# Patient Record
Sex: Male | Born: 1975
Health system: Southern US, Community
[De-identification: ages and names within clinical notes are randomized; demographics above are authoritative.]

## PROBLEM LIST (undated history)

## (undated) DIAGNOSIS — N4400 Torsion of testis, unspecified: Secondary | ICD-10-CM

## (undated) DIAGNOSIS — I10 Essential (primary) hypertension: Secondary | ICD-10-CM

## (undated) HISTORY — PX: FRACTURE SURGERY: SHX138

---
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
## 1997-09-07 ENCOUNTER — Emergency Department (HOSPITAL_COMMUNITY): Admission: EM | Admit: 1997-09-07 | Discharge: 1997-09-07 | Payer: Self-pay | Admitting: Emergency Medicine

## 1999-10-08 ENCOUNTER — Emergency Department (HOSPITAL_COMMUNITY): Admission: EM | Admit: 1999-10-08 | Discharge: 1999-10-08 | Payer: Self-pay | Admitting: *Deleted

## 1999-10-08 ENCOUNTER — Encounter: Payer: Self-pay | Admitting: Emergency Medicine

## 2000-07-08 ENCOUNTER — Encounter: Payer: Self-pay | Admitting: Emergency Medicine

## 2000-07-08 ENCOUNTER — Emergency Department (HOSPITAL_COMMUNITY): Admission: EM | Admit: 2000-07-08 | Discharge: 2000-07-08 | Payer: Self-pay | Admitting: Emergency Medicine

## 2000-09-07 ENCOUNTER — Encounter: Payer: Self-pay | Admitting: Emergency Medicine

## 2000-09-07 ENCOUNTER — Emergency Department (HOSPITAL_COMMUNITY): Admission: EM | Admit: 2000-09-07 | Discharge: 2000-09-07 | Payer: Self-pay | Admitting: Emergency Medicine

## 2000-09-13 ENCOUNTER — Inpatient Hospital Stay (HOSPITAL_COMMUNITY): Admission: EM | Admit: 2000-09-13 | Discharge: 2000-09-16 | Payer: Self-pay | Admitting: Emergency Medicine

## 2000-09-13 ENCOUNTER — Encounter: Payer: Self-pay | Admitting: Internal Medicine

## 2000-09-14 ENCOUNTER — Encounter: Payer: Self-pay | Admitting: Internal Medicine

## 2000-09-16 ENCOUNTER — Encounter: Payer: Self-pay | Admitting: Internal Medicine

## 2001-04-25 ENCOUNTER — Emergency Department (HOSPITAL_COMMUNITY): Admission: EM | Admit: 2001-04-25 | Discharge: 2001-04-25 | Payer: Self-pay | Admitting: *Deleted

## 2003-01-21 ENCOUNTER — Inpatient Hospital Stay (HOSPITAL_COMMUNITY): Admission: EM | Admit: 2003-01-21 | Discharge: 2003-01-27 | Payer: Self-pay | Admitting: Emergency Medicine

## 2003-01-28 ENCOUNTER — Encounter (HOSPITAL_BASED_OUTPATIENT_CLINIC_OR_DEPARTMENT_OTHER): Admission: RE | Admit: 2003-01-28 | Discharge: 2003-02-10 | Payer: Self-pay | Admitting: Orthopedic Surgery

## 2005-03-09 ENCOUNTER — Emergency Department (HOSPITAL_COMMUNITY): Admission: EM | Admit: 2005-03-09 | Discharge: 2005-03-10 | Payer: Self-pay | Admitting: Emergency Medicine

## 2006-06-24 ENCOUNTER — Encounter: Admission: RE | Admit: 2006-06-24 | Discharge: 2006-06-24 | Payer: Self-pay | Admitting: Family Medicine

## 2007-12-10 ENCOUNTER — Emergency Department (HOSPITAL_COMMUNITY): Admission: EM | Admit: 2007-12-10 | Discharge: 2007-12-10 | Payer: Self-pay | Admitting: Emergency Medicine

## 2010-08-11 NOTE — Discharge Summary (Signed)
[place]  Patient:    Carl Small, Carl Small                        MRN: [id number] Adm. Date:  [id number] Disc. Date: 09/16/00 Attending:  Phifer, Harriett Sine [place] Dictator:   Felton Clinton, M.D. CC:         Doris Cheadle. Lyman Bishop, M.D.   Discharge Summary  DISCHARGE DIAGNOSES: 1. Questionable left palatine tonsillar abscess, resolved. 2. Neutropenia, resolved.  DISCHARGE MEDICATIONS AND FOLLOW-UP:  Carl Small left the hospital against medical advice with no discharge medications.  He was given the number of the [place] to call for a follow-up appointment.  CONSULTATIONS:  Robert L. Lyman Bishop, M.D., ENT.  PROCEDURES: 1. CT of head on September 13, 2000, negative. 2. Lumbar puncture on September 13, 2000. 3. CT of neck on September 13, 2000, prominent tonsillar tissues with low-density    area in the left tonsils compatible with small tonsillar abscess. 4. CT of neck on September 16, 2000, with marked decrease in size of tonsils since    prior study with no current abscess demonstrated.  HISTORY OF PRESENT ILLNESS:  Carl Small is a 35 year old African-American male with no significant past medical history, who presented to the emergency department on September 13, 2000, complaining of severe headaches, fatigue, arthralgias/myalgias, and weakness for the past two weeks.  He was seen in the emergency department the week prior to admission for the "worst headache of his life," but left prior to work-up at that time.  On admission, he reported bilateral frontal headaches and projected fever and chills.  He has a history of migraine headaches for the past year, but states these had been worse for the past two weeks.  He also complained of significant cervical lymph node tenderness.  PHYSICAL EXAMINATION:  Temperature 102.4 degrees, pulse 98, blood pressure 114/62, respirations 26, oxygen saturation 100% on room air.  GENERAL APPEARANCE:  A  lethargic African-American male, but responsive and answers questions appropriately.  HEENT:  PERRL.  EOMI.  Oropharynx red, but no exudate able to be seen.  The patient is unable to open his mouth wide secondary to pain.  NECK:  Anterior cervical lymph nodes swollen and exquisitely tender bilaterally.  CARDIOVASCULAR:  Regular rate and rhythm.  A 2/6 systolic ejection murmur.  RESPIRATORY:  Clear with good air movement.  ABDOMEN:  Soft, flat, and diffusely tender to palpation.  No rebound or guarding.  EXTREMITIES:  No clubbing, cyanosis, or edema.  NEUROLOGIC:  Cranial nerves II-XII intact.  Nonfocal exam.  ADMISSION LABORATORY DATA:  White blood cell count 2.6, hemoglobin 15.6, platelets 150, absolute neutrophil count 0, absolute lymphocyte count 0.9, MCV 85, and atypical lymphocytes seen on smear.  Sodium 135, potassium 3.7, chloride 101, bicarbonate 28, BUN 10, creatinine 1.3, glucose 101, alkaline phosphatase 85, total bilirubin 1.3, AST 15, ALT 14, total protein 6.7, albumin 3.8, calcium 9.4.  The chest x-ray showed no active disease.  A CT of the head was negative.  HOSPITAL COURSE: #1 - QUESTIONABLE TONSILLAR ABSCESS:  Given Carl Small complaint of severe headaches and fevers, he was empirically given 2 g of Rocephin for possible meningitis.  A head CT was performed and as indicated above was negative. After these results were obtained, a lumbar puncture was performed.  The cerebrospinal fluid showed 22 protein, glucose 63, white blood cells 3, and red blood cells 2.  Grams negative, as  was [place] ink and cryptococcal antigen.  While in the emergency department, Carl Small continued to complain of severe throat and neck pain and was unable to fully open his mouth.  A neck CT was obtained and showed a low density area in the region of the left palatine tonsil concerning for a tonsillar abscess.  There were also several small lymph nodes in the carotid space and the  posterior triangle. Submandibular nodes were noted as well.  Due to the possibility of a tonsillar abscess and the patients allergy to penicillin, Carl Small was started on clindamycin.  Robert L. Lyman Bishop, M.D., was called to evaluate Carl Small and saw him on September 14, 2000.  After reviewing the CT with the radiologist, Molly Maduro L. Lawrence, M.D., was not convinced that Carl Small indeed had a tonsillar abscess and did not feel that incision and drainage were required at the present time.  Carl Small was changed to Primaxin for better coverage given his neutropenia and his symptoms resolved rather quickly on hospital day #3.  He was afebrile and a repeat neck CT done on September 16, 2000, showed a marked decrease in the size of the tonsils and no evidence of abscess was seen.  #2 - NEUTROPENIA:  Carl Small had an absolute neutropenia on admission with a neutrophil count of 0.  This was repeated and confirmed on hospital day #2. The etiology of his neutropenia was unclear as his white blood cell count had been normal the week prior to admission.  An HIV test was performed and was negative as was a mono spot test.  Carl Small white blood cell count on hospital day #3 began to trend up and on hospital day #4, his neutrophil count was within normal limits.  Again, the etiology of his neutropenia remains unclear.  DISCHARGE LABORATORY DATA:  White blood cell count 6.8, hemoglobin 14.9, MCV 86.3, platelets 176, absolute neutrophil count 1.6, and absolute lymphocyte count 2.2.  Atypical lymphocytes and mononuclear cells were seen on the smear. Sodium 140, potassium 4.1, chloride 109, bicarbonate 26, glucose 93, BUN 5, creatinine 1.2, calcium 9.0.  HIV nonreactive.  The urine drug screen was positive for cocaine, cannabinoids, and opiates.  Cerebrospinal fluid:  Protein 22, glucose 63, white blood cells 3, and red blood cells 6. The Grams smear showed no organisms and predominantly mononuclear  cells. Culture showed no growth at three days.  [place] ink preparation was negative as  was cryptococcal antigen.  AFB culture and smear were negative. Cytomegalovirus by PCR was negative as was CMV, ELISA antibody, and EBV PCR. DD:  09/27/00 TD:  09/27/00 Job: [id number]

## 2010-08-11 NOTE — Op Note (Signed)
Carl Small, Carl Small                           ACCOUNT NO.:  [id number]   MEDICAL RECORD NO.:  [id number]                   PATIENT TYPE:  INP   LOCATION:  0159                                 FACILITY:  [facility]   PHYSICIAN:  Carl Small, M.D.         DATE OF BIRTH:  1975/09/26   DATE OF PROCEDURE:  DATE OF DISCHARGE:                                 OPERATIVE REPORT   PREOPERATIVE DIAGNOSIS:  Right middle finger abscess in the deep pulp  tissue.   POSTOPERATIVE DIAGNOSIS:  Right middle finger abscess in the deep pulp  tissue.   PROCEDURE:  1. I&D large deep abscess right middle finger.  2. Nail plate removal right middle finger.   SURGEON:  Carl Ano. Amanda Pea, M.D.   ASSISTANT:  None.   COMPLICATIONS:  None.   ANESTHESIA:  General.   COMPLICATING FEATURES:  None.   ESTIMATED BLOOD LOSS:  Minimal.   SPECIMENS:  Specimens x2 sent for aerobic and anaerobic culture, fungal and  atypical mycobacterial cultures.   INDICATIONS FOR PROCEDURE:  This patient is a 35 year old male whose had a 9-  10 day history of infection in his finger. His is PENICILLIN allergic and  was seen in the [place] region and was placed on erythromycin. No I&D  was performed. The patient admits to picking at his finger just below the  nail with a sharp object and also admits to marijuana and cocaine use within  the last week. The patient has had progressive pain and worsening function.  I was asked to see him by the emergency room staff, Dr. Ileene Musa. It was  very apparent that the patient had an abscess in the pulp tissue and will  require extensive  I&D. I have given him no guarantees as the process is  well advanced and unfortunately there is risk of loss of length of the  finger if not the whole finger.   DESCRIPTION OF PROCEDURE:  The patient was seen by myself and anesthesia. He  agreed to surgery and understood the risks and benefits of bleeding,  infection, anesthesia, damage  to normal structure and failure of the surgery  to accomplish its intended goals of relieving symptoms and restoring  function. With this in mind, he desired to proceed. We proceeded to the  operative suite where he underwent induction of general anesthesia.  Following this, the patient then underwent prep and drape of the right upper  extremity with Betadine scrub and paint. Once the sterile field was secured  and he was appropriately padded, the patient had the nail plate removed  revealing abnormal beefy tissue and some abscess formation just about the  nail fold region. I then unroofed the devitalized skin and performed a I&D  where the area between the sterile matrix and pulp tissue met. This revealed  an extremely deep abscess which dissected into the pulp. I dissected into  this and released  a large amount of purulent material. This material was  decompressed and two sets of cultures were taken for aerobic, anaerobic,  Gram stain, fungal and atypical mycobacterial cultures. The patient did not  appear to have gross abscess in his flexor sheath. I did have to extend the  incision to the pulp. The patient did not appear to have significant bone  involvement, however, we will be checking and following the bone  radiographically. I I&D'd this area copiously with large amounts of saline  and decompressed the pulp nicely about the fibrous tissue. A large amount of  skin was devitalized and needed to be removed. The patient lost a large  amount of his pulp and fat due to the extensive infectious process  destroying this tissue. This was debrided sharply with a knife blade under  4.0 loupe magnification. The corners were debrided until healthy tissue was  noted. This appeared as a fish mouth type look to the finger. With this  done, further irrigation was applied followed by placement of Iodoform  gauze. Refill was excellent at the tip of the finger. I dressed it sterilely  and noted that  there was minimal blood loss but that all corners bled well  in the area of resection. Following this, I then placed a patient in a  sterile bandage. He was awakened from anesthesia and transferred to the  recovery room. The patient was somewhat agitated in the recovery room and  restraints were used for a brief period of time and he will be monitored  closely. I have discussed with the patient all issues; however, he is still  somewhat sedated from the surgery. We will monitor his condition closely and  hopefully afford arresting the infectious process.                                               Carl Small, M.D.    Carl Small  D:  01/22/2003  T:  01/22/2003  Job:  [id number]

## 2010-08-11 NOTE — Discharge Summary (Signed)
Carl Small, Carl Small                           ACCOUNT NO.:  [id number]   MEDICAL RECORD NO.:  [id number]                   PATIENT TYPE:  INP   LOCATION:  0466                                 [facility]ACILITY[facility]  [facility]   PHYSICIAN:  Dionne Ano. Amanda Pea, M.D.             DATE OF BIRTH:  1975/08/09   DATE OF ADMISSION:  01/21/2003  DATE OF DISCHARGE:  01/27/2003                                 DISCHARGE SUMMARY   ADMISSION DIAGNOSES:  1. Right ring finger distal tip abscess/cellulitis.  2. History of cocaine abuse.  3. History of tobacco abuse.  4. History of cannabis abuse.   DISCHARGE DIAGNOSES:  1. Right ring finger distal tip abscess/cellulitis, improved. Cultures     positive for methicillin-resistant Staphylococcus aureus.  2. History of cocaine abuse.  3. History of tobacco abuse.  4. History of cannabis abuse.   PROCEDURE/SURGEON:  The surgeon was Dr. Dominica Severin and the procedure was  I&D of right ring finger.   CONSULTATIONS:  Infectious disease.   BRIEF HOSPITAL COURSE:  Carl Small is a 35 year old gentleman who presented  to [facility] emergency room with complaints of  increasing swelling, pain, and tenderness about the distal tip of the right  ring finger. The patient states that approximately two weeks ago he injured  his finger on a nail. Since that time he had increased swelling, pain, and  tenderness.  He was previously seen in the ER in [place] and given  erythromycin as well as a tetanus injection. Unfortunately he has had  increased in his symptoms to the point he had to present to the emergency  room. The patient was seen and evaluation by Dr. Amanda Pea, orthopedic surgeon  on call, and found to have a right ring finger distal tip abscess with  cellulitis, possibly early purulent flexor tenosynovitis. Radiographs showed  no signs of osteomyelitis.   Carl Small underwent an I&D of the right finger of the skin, subcu, and  periosteal tissue. He  tolerated this well and there were no complications.  The patient was known to have a PENICILLIN allergy and therefore was started  on vancomycin and gentamycin as prophylactic antibiotics for pharmacy  dosing. Intraoperative cultures were obtained and showed that he had gram  positive cocci in pairs and clusters. The patient on postoperative day #2  was doing well other than increased pain and tenderness with his whirlpools.  Given this fact, he was started on valium and a morphine prior to his daily  hydrotherapy. The patient's condition continued to improve. He did have  significant discomfort with his hydrotherapy and was eventually started on  Ativan and continued morphine with good results. Final cultures reveal  methicillin-resistant Staphylococcus aureus. Infectious disease was  consulted. Recommended doxycycline p.o. 10 mg p.o. b.i.d. for fourteen days  status post at time of discharge. The patient's condition continued to  improve on January 27, 2003, after  day five of vancomycin. He was up, eating  breakfast. His pain was controlled with his medications and he was deemed  ready for discharge.  Vital signs were stable, he was afebrile. Dressings  were clean, dry, and intact. His motion had somewhat improved. His incision  was improving nicely. There was minimal edema and erythema had improved. His  range of motion was improving.   ASSESSMENT:  1. Status post irrigation and debridement right ring finger with cultures     indicative of methicillin-resistant Staphylococcus aureus.  2. History of cocaine abuse.  3. History of cannabis abuse.  4. History of tobacco abuse.   PLAN:  Condition on discharge improved. Diet is regular. Activity is to be  outpatient whirlpool, wick packing to close his wounds for granulating can  start.  We encourage range of motion and planning with dressings and  supplies to be used.   DISCHARGE MEDICATIONS:  Doxycycline _____ mg b.i.d. for four  weeks, Percocet  5/325 one to two p.o. q.4-6h. p.r.n. pain, Robaxin one p.o. q.4-6h p.r.n.  spasm.   FOLLOWUP:  With Dr. Amanda Pea per routine or call [phone] for any questions or  concerns. Care management with ________ and arranged outpatient whirlpool  and wound care.     Karie Chimera, P.A.-C.                   Dionne Ano. Amanda Pea, M.D.    BB/MEDQ  D:  03/10/2003  T:  03/10/2003  Job:  [id number]

## 2010-08-12 ENCOUNTER — Emergency Department (HOSPITAL_COMMUNITY): Payer: Self-pay

## 2010-08-12 ENCOUNTER — Emergency Department (HOSPITAL_COMMUNITY)
Admission: EM | Admit: 2010-08-12 | Discharge: 2010-08-12 | Disposition: A | Discharge status: Home / Self Care | Payer: Self-pay | Attending: Emergency Medicine | Admitting: Emergency Medicine

## 2010-08-12 DIAGNOSIS — J329 Chronic sinusitis, unspecified: Secondary | ICD-10-CM | POA: Insufficient documentation

## 2010-08-12 DIAGNOSIS — IMO0002 Reserved for concepts with insufficient information to code with codable children: Secondary | ICD-10-CM | POA: Insufficient documentation

## 2010-08-12 DIAGNOSIS — R55 Syncope and collapse: Secondary | ICD-10-CM | POA: Insufficient documentation

## 2010-08-12 DIAGNOSIS — M79609 Pain in unspecified limb: Secondary | ICD-10-CM | POA: Insufficient documentation

## 2010-08-12 DIAGNOSIS — W1809XA Striking against other object with subsequent fall, initial encounter: Secondary | ICD-10-CM | POA: Insufficient documentation

## 2010-08-12 DIAGNOSIS — R51 Headache: Secondary | ICD-10-CM | POA: Insufficient documentation

## 2010-08-12 DIAGNOSIS — E860 Dehydration: Secondary | ICD-10-CM | POA: Insufficient documentation

## 2010-08-12 LAB — CBC
HCT: 51.2 % (ref 39.0–52.0)
Hemoglobin: 18.2 g/dL — ABNORMAL HIGH (ref 13.0–17.0)
MCH: 31.3 pg (ref 26.0–34.0)
MCHC: 35.5 g/dL (ref 30.0–36.0)
MCV: 88 fL (ref 78.0–100.0)
Platelets: 159 10*3/uL (ref 150–400)
RBC: 5.82 MIL/uL — ABNORMAL HIGH (ref 4.22–5.81)
RDW: 13.4 % (ref 11.5–15.5)
WBC: 6.1 10*3/uL (ref 4.0–10.5)

## 2010-08-12 LAB — DIFFERENTIAL
Basophils Absolute: 0 10*3/uL (ref 0.0–0.1)
Basophils Relative: 0 % (ref 0–1)
Eosinophils Absolute: 0.1 10*3/uL (ref 0.0–0.7)
Eosinophils Relative: 2 % (ref 0–5)
Lymphocytes Relative: 28 % (ref 12–46)
Lymphs Abs: 1.7 10*3/uL (ref 0.7–4.0)
Monocytes Absolute: 0.8 10*3/uL (ref 0.1–1.0)
Monocytes Relative: 13 % — ABNORMAL HIGH (ref 3–12)
Neutro Abs: 3.5 10*3/uL (ref 1.7–7.7)
Neutrophils Relative %: 57 % (ref 43–77)

## 2010-08-12 LAB — BASIC METABOLIC PANEL
BUN: 11 mg/dL (ref 6–23)
CO2: 24 mEq/L (ref 19–32)
Calcium: 9.7 mg/dL (ref 8.4–10.5)
Chloride: 105 mEq/L (ref 96–112)
Creatinine, Ser: 1.23 mg/dL (ref 0.4–1.5)
GFR calc Af Amer: >60 mL/min (ref >60)
GFR calc non Af Amer: >60 mL/min (ref >60)
Glucose, Bld: 72 mg/dL (ref 70–99)
Potassium: 4 mEq/L (ref 3.5–5.1)
Sodium: 138 mEq/L (ref 135–145)

## 2010-08-12 LAB — URINALYSIS, ROUTINE W REFLEX MICROSCOPIC
Bilirubin Urine: NEGATIVE
Glucose, UA: NEGATIVE mg/dL
Hgb urine dipstick: NEGATIVE
Nitrite: NEGATIVE
Protein, ur: NEGATIVE mg/dL
Specific Gravity, Urine: 1.025 (ref 1.005–1.030)
Urobilinogen, UA: 1 mg/dL (ref 0.0–1.0)
pH: 6 (ref 5.0–8.0)

## 2010-08-12 LAB — CK: Total CK: 162 U/L (ref 7–232)

## 2011-01-23 ENCOUNTER — Emergency Department (HOSPITAL_COMMUNITY)
Admission: EM | Admit: 2011-01-23 | Discharge: 2011-01-23 | Disposition: A | Discharge status: Home / Self Care | Payer: Self-pay | Attending: Emergency Medicine | Admitting: Emergency Medicine

## 2011-01-23 DIAGNOSIS — R51 Headache: Secondary | ICD-10-CM | POA: Insufficient documentation

## 2011-01-23 DIAGNOSIS — R112 Nausea with vomiting, unspecified: Secondary | ICD-10-CM | POA: Insufficient documentation

## 2012-01-24 ENCOUNTER — Emergency Department (HOSPITAL_COMMUNITY)
Admission: EM | Admit: 2012-01-24 | Discharge: 2012-01-24 | Disposition: A | Discharge status: Home / Self Care | Payer: Self-pay | Attending: Emergency Medicine | Admitting: Emergency Medicine

## 2012-01-24 ENCOUNTER — Encounter (HOSPITAL_COMMUNITY): Payer: Self-pay | Admitting: Emergency Medicine

## 2012-01-24 DIAGNOSIS — L259 Unspecified contact dermatitis, unspecified cause: Secondary | ICD-10-CM | POA: Insufficient documentation

## 2012-01-24 DIAGNOSIS — L309 Dermatitis, unspecified: Secondary | ICD-10-CM

## 2012-01-24 HISTORY — DX: Torsion of testis, unspecified: N44.00

## 2012-01-24 LAB — URINALYSIS, ROUTINE W REFLEX MICROSCOPIC
Glucose, UA: NEGATIVE mg/dL
Hgb urine dipstick: NEGATIVE
Leukocytes, UA: NEGATIVE
Nitrite: NEGATIVE
Protein, ur: NEGATIVE mg/dL
Specific Gravity, Urine: 1.027 (ref 1.005–1.030)
Urobilinogen, UA: 2 mg/dL — ABNORMAL HIGH (ref 0.0–1.0)
pH: 6.5 (ref 5.0–8.0)

## 2012-01-24 LAB — CBC WITH DIFFERENTIAL/PLATELET
Basophils Absolute: 0 10*3/uL (ref 0.0–0.1)
Basophils Relative: 0 % (ref 0–1)
Eosinophils Absolute: 0.1 10*3/uL (ref 0.0–0.7)
Eosinophils Relative: 1 % (ref 0–5)
HCT: 50.3 % (ref 39.0–52.0)
Hemoglobin: 17.3 g/dL — ABNORMAL HIGH (ref 13.0–17.0)
Lymphocytes Relative: 20 % (ref 12–46)
Lymphs Abs: 1.8 10*3/uL (ref 0.7–4.0)
MCH: 30.1 pg (ref 26.0–34.0)
MCHC: 34.4 g/dL (ref 30.0–36.0)
MCV: 87.5 fL (ref 78.0–100.0)
Monocytes Absolute: 1 10*3/uL (ref 0.1–1.0)
Monocytes Relative: 11 % (ref 3–12)
Neutro Abs: 6.3 10*3/uL (ref 1.7–7.7)
Neutrophils Relative %: 68 % (ref 43–77)
Platelets: 198 10*3/uL (ref 150–400)
RBC: 5.75 MIL/uL (ref 4.22–5.81)
RDW: 13.7 % (ref 11.5–15.5)
WBC: 9.3 10*3/uL (ref 4.0–10.5)

## 2012-01-24 LAB — COMPREHENSIVE METABOLIC PANEL
ALT: 19 U/L (ref 0–53)
AST: 19 U/L (ref 0–37)
Albumin: 3.9 g/dL (ref 3.5–5.2)
Alkaline Phosphatase: 84 U/L (ref 39–117)
BUN: 9 mg/dL (ref 6–23)
CO2: 25 mEq/L (ref 19–32)
Calcium: 9.2 mg/dL (ref 8.4–10.5)
Chloride: 102 mEq/L (ref 96–112)
Creatinine, Ser: 1.33 mg/dL (ref 0.50–1.35)
GFR calc Af Amer: 80 mL/min — ABNORMAL LOW (ref >90)
GFR calc non Af Amer: 69 mL/min — ABNORMAL LOW (ref >90)
Glucose, Bld: 141 mg/dL — ABNORMAL HIGH (ref 70–99)
Potassium: 3.6 mEq/L (ref 3.5–5.1)
Sodium: 137 mEq/L (ref 135–145)
Total Bilirubin: 0.5 mg/dL (ref 0.3–1.2)
Total Protein: 6.8 g/dL (ref 6.0–8.3)

## 2012-01-24 MED ORDER — DIPHENHYDRAMINE HCL 25 MG PO CAPS
50.0000 mg | ORAL_CAPSULE | Freq: Once | ORAL | Status: AC
  Administered 2012-01-24: 50 mg via ORAL
  Filled 2012-01-24: qty 2

## 2012-01-24 MED ORDER — HYDROCORTISONE 1 % EX CREA
TOPICAL_CREAM | Freq: Once | CUTANEOUS | Status: AC
  Administered 2012-01-24: 1 via TOPICAL
  Filled 2012-01-24: qty 28

## 2012-01-24 MED ORDER — HYDROCORTISONE 1 % EX CREA: TOPICAL_CREAM | CUTANEOUS | Status: DC

## 2012-01-24 NOTE — Progress Notes (Signed)
Pt with no pcp nor coverage as confirmed by pt CM spoke with pt to review list of self pay pcps to further assist her with prescriptions and health care.  Discussed discounted pharmacies, DSS, health dept, needymeds.org and financial assistance programs in [place].  Pt voiced understanding and appreciation of resources and services offered 

## 2012-01-24 NOTE — ED Notes (Signed)
EAV:[id number]> Expected date:<BR> Expected time:<BR> Means of arrival:<BR> Comments:<BR> Triage 1 Lissa Hoard

## 2012-01-24 NOTE — ED Notes (Signed)
Patient reports pain of 10/10 in bilateral groin and testicles.  Patient states his pain woke him up during sleep and swelling worsened today.  Patient denies fevers.

## 2012-01-24 NOTE — ED Provider Notes (Signed)
History     CSN: [id number]  Arrival date & time 01/24/12  1620   First MD Initiated Contact with Patient 01/24/12 1646      Chief Complaint  Patient presents with  . Groin Swelling     The history is provided by the patient.   the patient reports developing irritation of the scrotum after using a new body wash in his groin area last night while in the shower.  He reports this is the first time is ever use this body wash.  He used it last night and focused in his groin area.  This morning he began having itching and discomfort in his groin and scrotum.  He denies any penile discharge.  He has no dysuria or urinary frequency.  He denies fevers or chills.  He is not a diabetic.  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  He has no nausea or vomiting.  He reports a similar episode when he used a new D. urine and his axilla several years ago where he broke out into an inflamed state in his bilateral axillas that usually resolves.  He states he always uses Dove soap because anything more than that usually irritates his skin.  His symptoms are mild to moderate.  Given the fact that his discomfort became worse he came to the ER for evaluation.  Past Medical History  Diagnosis Date  . Torsion of testicle     History reviewed. No pertinent past surgical history.  No family history on file.  History  Substance Use Topics  . Smoking status: Never Smoker   . Smokeless tobacco: Not on file  . Alcohol Use: Yes      Review of Systems  All other systems reviewed and are negative.    Allergies  Penicillins  Home Medications   Current Outpatient Rx  Name Route Sig Dispense Refill  . ASPIRIN-ACETAMINOPHEN-CAFFEINE 250-250-65 MG PO TABS Oral Take 1 tablet by mouth every 6 (six) hours as needed. Migraine    . HYDROCORTISONE 1 % EX CREA  Apply to affected area 2 times daily 15 g 0    BP 143/90  Pulse 96  Temp 98.1 F (36.7 C)  (Oral)  Resp 18  Ht 6\' 1"  (1.854 m)  Wt 225 lb (102.059 kg)  BMI 29.69 kg/m2  SpO2 98%  Physical Exam  Constitutional: He is oriented to person, place, and time. He appears well-developed and well-nourished.  HENT:  Head: Normocephalic.  Eyes: EOM are normal.  Neck: Normal range of motion.  Pulmonary/Chest: Effort normal.  Abdominal: He exhibits no distension.  Genitourinary:       Patient with signs of dermatitis of his scrotum in the base of his penis.  There is no erythema or warmth.  No crepitus noted.  Very small bilateral groin lymphadenopathy noted.  No signs of abscess or folliculitis.  No penile discharge.  No testicular tenderness.  Testicular lie is normal  Musculoskeletal: Normal range of motion.  Neurological: He is alert and oriented to person, place, and time.  Psychiatric: He has a normal mood and affect.    ED Course  Procedures (including critical care time)  Labs Reviewed  CBC WITH DIFFERENTIAL - Abnormal; Notable for the following:    Hemoglobin 17.3 (*)     All other components within normal limits  COMPREHENSIVE METABOLIC PANEL - Abnormal; Notable for the following:    Glucose, Bld 141 (*)  GFR calc non Af Amer 69 (*)     GFR calc Af Amer 80 (*)     All other components within normal limits  URINALYSIS, ROUTINE W REFLEX MICROSCOPIC - Abnormal; Notable for the following:    Color, Urine AMBER (*)  BIOCHEMICALS MAY BE AFFECTED BY COLOR   Bilirubin Urine SMALL (*)     Ketones, ur TRACE (*)     Urobilinogen, UA 2.0 (*)     All other components within normal limits   No results found.   1. Dermatitis       MDM  This appears to be a severe contact dermatitis.  He had a similar event in his axilla with the use of a certain 2 urine.  He states his symptoms are getting worse.  I see nothing to suggest that this is for nasal gangrene or other severe scrotal infection.  He has improvement in his symptoms after Benadryl and hydrocortisone cream.  Will  treat this as a dermatitis.  He understands return to the ER for new or worsening symptoms        Lyanne Co, MD 01/24/12 1858

## 2012-01-28 ENCOUNTER — Encounter (HOSPITAL_BASED_OUTPATIENT_CLINIC_OR_DEPARTMENT_OTHER): Payer: Self-pay | Admitting: *Deleted

## 2012-01-28 ENCOUNTER — Emergency Department (HOSPITAL_BASED_OUTPATIENT_CLINIC_OR_DEPARTMENT_OTHER)
Admission: EM | Admit: 2012-01-28 | Discharge: 2012-01-28 | Disposition: A | Discharge status: Home / Self Care | Payer: Self-pay | Attending: Emergency Medicine | Admitting: Emergency Medicine

## 2012-01-28 DIAGNOSIS — R21 Rash and other nonspecific skin eruption: Secondary | ICD-10-CM

## 2012-01-28 DIAGNOSIS — Z880 Allergy status to penicillin: Secondary | ICD-10-CM | POA: Insufficient documentation

## 2012-01-28 DIAGNOSIS — L293 Anogenital pruritus, unspecified: Secondary | ICD-10-CM | POA: Insufficient documentation

## 2012-01-28 DIAGNOSIS — Z7982 Long term (current) use of aspirin: Secondary | ICD-10-CM | POA: Insufficient documentation

## 2012-01-28 LAB — BASIC METABOLIC PANEL
BUN: 15 mg/dL (ref 6–23)
CO2: 24 mEq/L (ref 19–32)
Calcium: 9.5 mg/dL (ref 8.4–10.5)
Chloride: 103 mEq/L (ref 96–112)
Creatinine, Ser: 1.3 mg/dL (ref 0.50–1.35)
GFR calc Af Amer: 82 mL/min — ABNORMAL LOW (ref >90)
GFR calc non Af Amer: 71 mL/min — ABNORMAL LOW (ref >90)
Glucose, Bld: 80 mg/dL (ref 70–99)
Potassium: 4.5 mEq/L (ref 3.5–5.1)
Sodium: 139 mEq/L (ref 135–145)

## 2012-01-28 LAB — CBC WITH DIFFERENTIAL/PLATELET
Basophils Absolute: 0 10*3/uL (ref 0.0–0.1)
Basophils Relative: 0 % (ref 0–1)
Eosinophils Absolute: 0.4 10*3/uL (ref 0.0–0.7)
Eosinophils Relative: 5 % (ref 0–5)
HCT: 46.8 % (ref 39.0–52.0)
Hemoglobin: 16.2 g/dL (ref 13.0–17.0)
Lymphocytes Relative: 28 % (ref 12–46)
Lymphs Abs: 2.2 10*3/uL (ref 0.7–4.0)
MCH: 30.5 pg (ref 26.0–34.0)
MCHC: 34.6 g/dL (ref 30.0–36.0)
MCV: 88 fL (ref 78.0–100.0)
Monocytes Absolute: 1.1 10*3/uL — ABNORMAL HIGH (ref 0.1–1.0)
Monocytes Relative: 14 % — ABNORMAL HIGH (ref 3–12)
Neutro Abs: 4 10*3/uL (ref 1.7–7.7)
Neutrophils Relative %: 53 % (ref 43–77)
Platelets: 188 10*3/uL (ref 150–400)
RBC: 5.32 MIL/uL (ref 4.22–5.81)
RDW: 14 % (ref 11.5–15.5)
WBC: 7.6 10*3/uL (ref 4.0–10.5)

## 2012-01-28 LAB — LACTIC ACID, PLASMA: Lactic Acid, Venous: 1.3 mmol/L (ref 0.5–2.2)

## 2012-01-28 MED ORDER — SODIUM CHLORIDE 0.9 % IV BOLUS (SEPSIS)
1000.0000 mL | Freq: Once | INTRAVENOUS | Status: AC
  Administered 2012-01-28: 1000 mL via INTRAVENOUS

## 2012-01-28 MED ORDER — SODIUM CHLORIDE 0.9 % IV SOLN
Freq: Once | INTRAVENOUS | Status: AC
  Administered 2012-01-28: 20 mL/h via INTRAVENOUS

## 2012-01-28 MED ORDER — OXYCODONE-ACETAMINOPHEN 5-325 MG PO TABS: 1.0000 | ORAL_TABLET | ORAL | Status: DC | PRN

## 2012-01-28 MED ORDER — TOLNAFTATE 1 % EX POWD: Freq: Two times a day (BID) | CUTANEOUS | Status: DC

## 2012-01-28 MED ORDER — HYDROMORPHONE HCL PF 1 MG/ML IJ SOLN
1.0000 mg | Freq: Once | INTRAMUSCULAR | Status: AC
  Administered 2012-01-28: 1 mg via INTRAVENOUS
  Filled 2012-01-28: qty 1

## 2012-01-28 MED ORDER — LIDOCAINE HCL 2 % EX GEL
Freq: Once | CUTANEOUS | Status: AC
  Administered 2012-01-28: 20 via TOPICAL
  Filled 2012-01-28: qty 20

## 2012-01-28 MED ORDER — LIDOCAINE HCL 2 % EX GEL: Freq: Once | CUTANEOUS | Status: DC

## 2012-01-28 MED ORDER — DOXYCYCLINE HYCLATE 100 MG PO TABS
100.0000 mg | ORAL_TABLET | Freq: Once | ORAL | Status: AC
  Administered 2012-01-28: 100 mg via ORAL
  Filled 2012-01-28: qty 1

## 2012-01-28 MED ORDER — DOXYCYCLINE HYCLATE 100 MG PO CAPS: 100.0000 mg | ORAL_CAPSULE | Freq: Two times a day (BID) | ORAL | Status: DC

## 2012-01-28 NOTE — ED Notes (Signed)
Pt c/o rash to testicles x 5 days , seen by [facility] ed on Thursday  Cream and benadryl not working

## 2012-01-28 NOTE — ED Provider Notes (Signed)
History     CSN: [id number]  Arrival date & time 01/28/12  9528   First MD Initiated Contact with Patient 01/28/12 1943      Chief Complaint  Patient presents with  . Rash    Patient is a 36 y.o. male presenting with rash. The history is provided by the patient.  Rash  This is a recurrent problem. The current episode started more than 2 days ago. The problem has been gradually worsening. The problem is associated with a new detergent/soap. There has been no fever. Affected Location: groin. The pain is severe. The pain has been constant since onset. Associated symptoms include itching and pain. He has tried steriods for the symptoms. The treatment provided no relief.  pt seen recently for dermatitis to his groin.  He was placed on steroid cream but his symptoms are worsening No fever/vomiting.  No trauma.  No other rash to his body.  He thinks it was due to recent new soap He is able to pass urine without difficulty  Past Medical History  Diagnosis Date  . Torsion of testicle     History reviewed. No pertinent past surgical history.  History reviewed. No pertinent family history.  History  Substance Use Topics  . Smoking status: Current Every Day Smoker -- 0.5 packs/day  . Smokeless tobacco: Not on file  . Alcohol Use: Yes      Review of Systems  Constitutional: Negative for fever.  Skin: Positive for itching and rash.  Neurological: Negative for weakness.  All other systems reviewed and are negative.    Allergies  Penicillins  Home Medications   Current Outpatient Rx  Name  Route  Sig  Dispense  Refill  . ASPIRIN-ACETAMINOPHEN-CAFFEINE 250-250-65 MG PO TABS   Oral   Take 1 tablet by mouth every 6 (six) hours as needed. Migraine         . HYDROCORTISONE 1 % EX CREA      Apply to affected area 2 times daily   15 g   0     BP 145/90  Pulse 77  Temp 98.1 F (36.7 C) (Oral)  Resp 16  Ht 6\' 1"  (1.854 m)  Wt 225 lb (102.059 kg)  BMI 29.69 kg/m2   SpO2 100%  Physical Exam CONSTITUTIONAL: Well developed/well nourished HEAD AND FACE: Normocephalic/atraumatic EYES: EOMI/PERRL ENMT: Mucous membranes moist NECK: supple no meningeal signs SPINE:entire spine nontender CV: S1/S2 noted, no murmurs/rubs/gallops noted LUNGS: Lungs are clear to auscultation bilaterally, no apparent distress ABDOMEN: soft, nontender, no rebound or guarding UX:LKGMWNU is tender to palpation.  There is erythema noted, mostly on right side of scrotum. No drainage.  No crepitance.  Testicles are in normal lie  Chaperone present NEURO: Pt is awake/alert, moves all extremitiesx4 EXTREMITIES: pulses normal, full ROM. Erythema noted to inner thighs bilaterally SKIN: warm, color normal PSYCH:anxious  ED Course  Procedures  8:10 PM Pt with severe pain due to rash/excoriation.  Will treat pain and reassess   Pt improved with pain meds Pt has some erythema/excoriation to the right side of his scrotum, but no active bleeding, no crepitance.  No tenderness/erythema/abscess noted in perineum.  Doubtful this is fourniers gangrene.  No signs of abscess.  He also has some erythema along inner thighs.  Suspect there may some fungal component (tinea cruris) tinactin ordered as well doxycycline as he could have early cellulitis.  He is nontoxic in appearance.  Discussed need for repeat exam in 24-48 hours if he is not  improving  MDM  Nursing notes including past medical history and social history reviewed and considered in documentation Previous records reviewed and considered - recent ED notes reviewed Labs/vital reviewed and considered         Joya Gaskins, MD 01/28/12 2246

## 2012-01-30 ENCOUNTER — Encounter (HOSPITAL_BASED_OUTPATIENT_CLINIC_OR_DEPARTMENT_OTHER): Payer: Self-pay | Admitting: *Deleted

## 2012-01-30 ENCOUNTER — Emergency Department (HOSPITAL_BASED_OUTPATIENT_CLINIC_OR_DEPARTMENT_OTHER)
Admission: EM | Admit: 2012-01-30 | Discharge: 2012-01-30 | Disposition: A | Discharge status: Home / Self Care | Payer: Self-pay | Attending: Emergency Medicine | Admitting: Emergency Medicine

## 2012-01-30 DIAGNOSIS — L259 Unspecified contact dermatitis, unspecified cause: Secondary | ICD-10-CM | POA: Insufficient documentation

## 2012-01-30 DIAGNOSIS — F17200 Nicotine dependence, unspecified, uncomplicated: Secondary | ICD-10-CM | POA: Insufficient documentation

## 2012-01-30 DIAGNOSIS — R21 Rash and other nonspecific skin eruption: Secondary | ICD-10-CM | POA: Insufficient documentation

## 2012-01-30 DIAGNOSIS — Z87448 Personal history of other diseases of urinary system: Secondary | ICD-10-CM | POA: Insufficient documentation

## 2012-01-30 MED ORDER — PREDNISONE 10 MG PO TABS: 20.0000 mg | ORAL_TABLET | Freq: Two times a day (BID) | ORAL | Status: DC

## 2012-01-30 MED ORDER — NYSTATIN 100000 UNIT/GM EX POWD: Freq: Four times a day (QID) | CUTANEOUS | Status: DC

## 2012-01-30 NOTE — ED Provider Notes (Signed)
History     CSN: [id number]  Arrival date & time 01/30/12  1927   First MD Initiated Contact with Patient 01/30/12 1940      Chief Complaint  Patient presents with  . Wound Check    (Consider location/radiation/quality/duration/timing/severity/associated sxs/prior treatment) HPI Comments: Patient for recheck of rash on his scrotum.  This started about one week ago after using a new body wash.  He has been seen twice since that time and diagnosed with contact dermatitis, possible fungal and bacterial infections.  He is not improving with doxycycline, tinactin, and hydrocortisone cream.  These medications burn severely when he applies them.  There are no urinary symptoms.  No fevers or chills.  Denies new sexual contacts.    Patient is a 36 y.o. male presenting with wound check. The history is provided by the patient.  Wound Check  Treated in ED: one week ago. Treatments since wound repair include oral antibiotics.    Past Medical History  Diagnosis Date  . Torsion of testicle     History reviewed. No pertinent past surgical history.  History reviewed. No pertinent family history.  History  Substance Use Topics  . Smoking status: Current Every Day Smoker -- 0.5 packs/day  . Smokeless tobacco: Not on file  . Alcohol Use: Yes      Review of Systems  All other systems reviewed and are negative.    Allergies  Penicillins  Home Medications   Current Outpatient Rx  Name  Route  Sig  Dispense  Refill  . DOXYCYCLINE HYCLATE 100 MG PO CAPS   Oral   Take 1 capsule (100 mg total) by mouth 2 (two) times daily.   14 capsule   0   . OXYCODONE-ACETAMINOPHEN 5-325 MG PO TABS   Oral   Take 1 tablet by mouth every 4 (four) hours as needed for pain.   15 tablet   0   . ASPIRIN-ACETAMINOPHEN-CAFFEINE 250-250-65 MG PO TABS   Oral   Take 1 tablet by mouth every 6 (six) hours as needed. Migraine         . HYDROCORTISONE 1 % EX CREA      Apply to affected area 2 times  daily   15 g   0   . TOLNAFTATE 1 % EX POWD   Topical   Apply topically 2 (two) times daily.   45 g   0     BP 136/93  Pulse 70  Temp 98.2 F (36.8 C) (Oral)  Resp 18  Ht 6' (1.829 m)  Wt 225 lb (102.059 kg)  BMI 30.52 kg/m2  SpO2 98%  Physical Exam  Nursing note and vitals reviewed. Constitutional: He is oriented to person, place, and time. He appears well-developed and well-nourished. No distress.  HENT:  Head: Normocephalic.  Mouth/Throat: Oropharynx is clear and moist.  Neck: Normal range of motion. Neck supple.  Genitourinary:       There is an excoriated area to the anterior aspect of the scrotum below the penis.  There is no erythema or warmth that I can appreciate and I do not feel any crepitus.  He is quite tender in this area.  Neurological: He is alert and oriented to person, place, and time.  Skin: Skin is warm and dry. He is not diaphoretic.    ED Course  Procedures (including critical care time)  Labs Reviewed - No data to display No results found.   No diagnosis found.    MDM  This  area appears to be a severe contact dermatitis, possibly a candidal infection.  I will have him stop all creams and try nystatin powder, prednisone, and continue the doxycycline he was started on.          Geoffery Lyons, MD 01/30/12 2028

## 2012-01-30 NOTE — ED Notes (Signed)
Pt here for recheck of wound to groin area

## 2013-08-01 ENCOUNTER — Other Ambulatory Visit: Payer: Self-pay

## 2013-08-01 ENCOUNTER — Emergency Department (HOSPITAL_COMMUNITY)
Admission: EM | Admit: 2013-08-01 | Discharge: 2013-08-01 | Discharge status: Against Medical Advice | Payer: Self-pay | Attending: Emergency Medicine | Admitting: Emergency Medicine

## 2013-08-01 ENCOUNTER — Encounter (HOSPITAL_COMMUNITY): Payer: Self-pay | Admitting: Emergency Medicine

## 2013-08-01 ENCOUNTER — Encounter (HOSPITAL_BASED_OUTPATIENT_CLINIC_OR_DEPARTMENT_OTHER): Payer: Self-pay | Admitting: Emergency Medicine

## 2013-08-01 ENCOUNTER — Emergency Department (HOSPITAL_BASED_OUTPATIENT_CLINIC_OR_DEPARTMENT_OTHER)
Admission: EM | Admit: 2013-08-01 | Discharge: 2013-08-01 | Disposition: A | Discharge status: Home / Self Care | Payer: Self-pay | Attending: Emergency Medicine | Admitting: Emergency Medicine

## 2013-08-01 ENCOUNTER — Emergency Department (HOSPITAL_COMMUNITY): Payer: Self-pay

## 2013-08-01 DIAGNOSIS — R0789 Other chest pain: Secondary | ICD-10-CM

## 2013-08-01 DIAGNOSIS — F17200 Nicotine dependence, unspecified, uncomplicated: Secondary | ICD-10-CM | POA: Insufficient documentation

## 2013-08-01 DIAGNOSIS — R071 Chest pain on breathing: Secondary | ICD-10-CM | POA: Insufficient documentation

## 2013-08-01 DIAGNOSIS — R0602 Shortness of breath: Secondary | ICD-10-CM | POA: Insufficient documentation

## 2013-08-01 DIAGNOSIS — Z87448 Personal history of other diseases of urinary system: Secondary | ICD-10-CM | POA: Insufficient documentation

## 2013-08-01 DIAGNOSIS — Z880 Allergy status to penicillin: Secondary | ICD-10-CM | POA: Insufficient documentation

## 2013-08-01 DIAGNOSIS — R42 Dizziness and giddiness: Secondary | ICD-10-CM | POA: Insufficient documentation

## 2013-08-01 DIAGNOSIS — M62838 Other muscle spasm: Secondary | ICD-10-CM

## 2013-08-01 DIAGNOSIS — R209 Unspecified disturbances of skin sensation: Secondary | ICD-10-CM | POA: Insufficient documentation

## 2013-08-01 LAB — BASIC METABOLIC PANEL
BUN: 18 mg/dL (ref 6–23)
CHLORIDE: 100 meq/L (ref 96–112)
CO2: 24 meq/L (ref 19–32)
Calcium: 9.7 mg/dL (ref 8.4–10.5)
Creatinine, Ser: 1.46 mg/dL — ABNORMAL HIGH (ref 0.50–1.35)
GFR calc Af Amer: 70 mL/min — ABNORMAL LOW (ref >90)
GFR calc non Af Amer: 61 mL/min — ABNORMAL LOW (ref >90)
Glucose, Bld: 107 mg/dL — ABNORMAL HIGH (ref 70–99)
Potassium: 4.5 mEq/L (ref 3.7–5.3)
SODIUM: 140 meq/L (ref 137–147)

## 2013-08-01 LAB — CBC
HEMATOCRIT: 52.6 % — AB (ref 39.0–52.0)
HEMOGLOBIN: 18 g/dL — AB (ref 13.0–17.0)
MCH: 31 pg (ref 26.0–34.0)
MCHC: 34.2 g/dL (ref 30.0–36.0)
MCV: 90.7 fL (ref 78.0–100.0)
PLATELETS: 220 10*3/uL (ref 150–400)
RBC: 5.8 MIL/uL (ref 4.22–5.81)
RDW: 13.8 % (ref 11.5–15.5)
WBC: 7.5 10*3/uL (ref 4.0–10.5)

## 2013-08-01 LAB — I-STAT TROPONIN, ED: TROPONIN I, POC: 0 ng/mL (ref 0.00–0.08)

## 2013-08-01 LAB — PRO B NATRIURETIC PEPTIDE

## 2013-08-01 MED ORDER — CYCLOBENZAPRINE HCL 10 MG PO TABS: 10.0000 mg | ORAL_TABLET | Freq: Three times a day (TID) | ORAL | Status: DC | PRN

## 2013-08-01 MED ORDER — IBUPROFEN 800 MG PO TABS: 800.0000 mg | ORAL_TABLET | Freq: Three times a day (TID) | ORAL | Status: DC | PRN

## 2013-08-01 NOTE — ED Notes (Signed)
C/o L sided chest tightness, dizziness, and sob that started 45 min ago while sitting on couch.  Pt states he had just got home from work and finished smoking a cigarette.  Denies nausea or vomiting.

## 2013-08-01 NOTE — ED Notes (Signed)
Patient here with 1 week of chest pain that is increasing the past 2 days. Pain increased again today. Also complains of left sided numbness x 2 days, reports that he feels as if he cant lift his arm

## 2013-08-01 NOTE — ED Provider Notes (Signed)
CSN: [id number]     Arrival date & time 08/01/13  [id number] History  This chart was scribed for Charles B. Bernette MayersSheldon, MD by Danella Maiersaroline Early, ED Scribe. This patient was seen in room MH12/MH12 and the patient's care was started at 6:35 PM.    Chief Complaint  Patient presents with  . Chest Pain   The history is provided by the patient. No language interpreter was used.   HPI Comments: Carl Small is a 38 y.o. male who presents to the Emergency Department complaining of left-sided CP onset one week ago that worsened in the last 2 days described as tightness. He states it worsens with deep breathing. He reports associated intermittent left arm numbness and weakness over the last 2 days and reports 2 episodes where he could not lift his arm, lasted minutes. He is a smoker. He denies long car or plane rides. He denies increased activity recently.    Past Medical History  Diagnosis Date  . Torsion of testicle    History reviewed. No pertinent past surgical history. No family history on file. History  Substance Use Topics  . Smoking status: Current Every Day Smoker -- 0.50 packs/day  . Smokeless tobacco: Not on file  . Alcohol Use: Yes    Review of Systems  Cardiovascular: Positive for chest pain.  Neurological: Positive for numbness.   A complete 10 system review of systems was obtained and all systems are negative except as noted in the HPI and PMH.     Allergies  Penicillins  Home Medications   Prior to Admission medications   Not on File   BP 123/85  Pulse 72  Temp(Src) 98 F (36.7 C)  Resp 18  SpO2 100% Physical Exam  Nursing note and vitals reviewed. Constitutional: He is oriented to person, place, and time. He appears well-developed and well-nourished.  HENT:  Head: Normocephalic and atraumatic.  Eyes: EOM are normal. Pupils are equal, round, and reactive to light.  Neck: Normal range of motion. Neck supple.  Cardiovascular: Normal rate, normal heart sounds and intact  distal pulses.   Pulmonary/Chest: Effort normal and breath sounds normal. He exhibits tenderness.  Abdominal: Bowel sounds are normal. He exhibits no distension. There is no tenderness.  Musculoskeletal: Normal range of motion. He exhibits tenderness (L shoulder muscles). He exhibits no edema.  Neurological: He is alert and oriented to person, place, and time. He has normal strength. No cranial nerve deficit or sensory deficit.  Skin: Skin is warm and dry. No rash noted.  Psychiatric: He has a normal mood and affect.    ED Course  Procedures (including critical care time) Medications - No data to display  DIAGNOSTIC STUDIES: Oxygen Saturation is 100% on RA, normal by my interpretation.    COORDINATION OF CARE: 6:43 PM- Discussed treatment plan with pt. Pt agrees to plan.    Labs Review Labs Reviewed - No data to display  Imaging Review Dg Chest 2 View  08/01/2013   CLINICAL DATA:  Chest pain  EXAM: CHEST  2 VIEW  COMPARISON:  12/10/2007  FINDINGS: Normal heart size and mediastinal contours. No acute infiltrate or edema. No effusion or pneumothorax. No acute osseous findings.  IMPRESSION: No active cardiopulmonary disease.   Electronically Signed   By: Tiburcio PeaJonathan  Watts M.D.   On: 08/01/2013 02:29     EKG Interpretation   Date/Time:  Saturday Aug 01 2013 18:28:40 EDT Ventricular Rate:  76 PR Interval:  148 QRS Duration: 80 QT Interval:  378 QTC Calculation: 425 R Axis:   90 Text Interpretation:  Normal sinus rhythm Rightward axis Borderline ECG  Since last tracing Rate slower Confirmed by [facility] Center[facility]LD[facility]  MD, Leonette MostHARLES [phone]([id number])  on 08/01/2013 6:37:00 PM      MDM   Final diagnoses:  Chest wall pain  Muscle spasm of left shoulder area    [facility] but left prior to being seen. He did have labs done though that were normal, including Trop and BNP. Low risk for CAD, reproducible pain with palpation and ROM of LUE. NSAIDs, rest, PCP followup.   I  personally performed the services described in this documentation, which was scribed in my presence. The recorded information has been reviewed and is accurate.      Charles B. Bernette MayersSheldon, MD 08/02/13 [phone]

## 2013-08-01 NOTE — Discharge Instructions (Signed)
Chest Wall Pain °Chest wall pain is pain in or around the bones and muscles of your chest. It may take up to 6 weeks to get better. It may take longer if you must stay physically active in your work and activities.  °CAUSES  °Chest wall pain may happen on its own. However, it may be caused by: °· A viral illness like the flu. °· Injury. °· Coughing. °· Exercise. °· Arthritis. °· Fibromyalgia. °· Shingles. °HOME CARE INSTRUCTIONS  °· Avoid overtiring physical activity. Try not to strain or perform activities that cause pain. This includes any activities using your chest or your abdominal and side muscles, especially if heavy weights are used. °· Put ice on the sore area. °· Put ice in a plastic bag. °· Place a towel between your skin and the bag. °· Leave the ice on for 15-20 minutes per hour while awake for the first 2 days. °· Only take over-the-counter or prescription medicines for pain, discomfort, or fever as directed by your caregiver. °SEEK IMMEDIATE MEDICAL CARE IF:  °· Your pain increases, or you are very uncomfortable. °· You have a fever. °· Your chest pain becomes worse. °· You have new, unexplained symptoms. °· You have nausea or vomiting. °· You feel sweaty or lightheaded. °· You have a cough with phlegm (sputum), or you cough up blood. °MAKE SURE YOU:  °· Understand these instructions. °· Will watch your condition. °· Will get help right away if you are not doing well or get worse. °Document Released: 03/12/2005 Document Revised: 06/04/2011 Document Reviewed: 11/06/2010 °ExitCare® Patient Information ©2014 [employer]. °Musculoskeletal Pain °Musculoskeletal pain is muscle and boney aches and pains. These pains can occur in any part of the body. Your caregiver may treat you without knowing the cause of the pain. They may treat you if blood or urine tests, X-rays, and other tests were normal.  °CAUSES °There is often not a definite cause or reason for these pains. These pains may be caused by a type of  germ (virus). The discomfort may also come from overuse. Overuse includes working out too hard when your body is not fit. Boney aches also come from weather changes. Bone is sensitive to atmospheric pressure changes. °HOME CARE INSTRUCTIONS  °Ask when your test results will be ready. Make sure you get your test results. °Only take over-the-counter or prescription medicines for pain, discomfort, or fever as directed by your caregiver. If you were given medications for your condition, do not drive, operate machinery or power tools, or sign legal documents for 24 hours. Do not drink alcohol. Do not take sleeping pills or other medications that may interfere with treatment. °Continue all activities unless the activities cause more pain. When the pain lessens, slowly resume normal activities. Gradually increase the intensity and duration of the activities or exercise. °During periods of severe pain, bed rest may be helpful. Lay or sit in any position that is comfortable. °Putting ice on the injured area. °Put ice in a bag. °Place a towel between your skin and the bag. °Leave the ice on for 15 to 20 minutes, 3 to 4 times a day. °Follow up with your caregiver for continued problems and no reason can be found for the pain. If the pain becomes worse or does not go away, it may be necessary to repeat tests or do additional testing. Your caregiver may need to look further for a possible cause. °SEEK IMMEDIATE MEDICAL CARE IF: °You have pain that is getting worse   and is not relieved by medications. °You develop chest pain that is associated with shortness or breath, sweating, feeling sick to your stomach (nauseous), or throw up (vomit). °Your pain becomes localized to the abdomen. °You develop any new symptoms that seem different or that concern you. °MAKE SURE YOU:  °Understand these instructions. °Will watch your condition. °Will get help right away if you are not doing well or get worse. °Document Released: 03/12/2005  Document Revised: 06/04/2011 Document Reviewed: 11/14/2012 °ExitCare® Patient Information ©2014 [employer]. ° °

## 2013-08-01 NOTE — ED Notes (Signed)
No answer when called 2 times to go back to the room

## 2013-09-26 ENCOUNTER — Emergency Department (INDEPENDENT_AMBULATORY_CARE_PROVIDER_SITE_OTHER)
Admission: EM | Admit: 2013-09-26 | Discharge: 2013-09-26 | Disposition: A | Discharge status: Home / Self Care | Payer: Self-pay | Source: Home / Self Care | Attending: Emergency Medicine | Admitting: Emergency Medicine

## 2013-09-26 ENCOUNTER — Encounter (HOSPITAL_COMMUNITY): Payer: Self-pay | Admitting: Emergency Medicine

## 2013-09-26 DIAGNOSIS — L259 Unspecified contact dermatitis, unspecified cause: Secondary | ICD-10-CM

## 2013-09-26 MED ORDER — HYDROCORTISONE 1 % EX CREA: TOPICAL_CREAM | CUTANEOUS | Status: DC

## 2013-09-26 MED ORDER — PREDNISONE 20 MG PO TABS: 20.0000 mg | ORAL_TABLET | Freq: Two times a day (BID) | ORAL | Status: DC

## 2013-09-26 NOTE — ED Notes (Signed)
C/O penile itching and irritation x few days without discharge.  Girlfriend was diagnosed with BV this morning.

## 2013-09-26 NOTE — Discharge Instructions (Signed)

## 2013-09-26 NOTE — ED Provider Notes (Signed)
  Chief Complaint   Chief Complaint  Patient presents with  . Rash    History of Present Illness   Carl Small is a 38 year old male who has had a 3-4 day history of penile itching, irritation, and rash. He denies any dysuria or discharge. No ulcers or blisters on the penis. He denies any swelling of the testicles, testicular pain, or inguinal lymphadenopathy. He has had no fever, chills, nausea, vomiting, or abdominal pain. He uses condoms. His girlfriend has been diagnosed with bacterial vaginosis. He was recently tested for STDs and everything came back negative.  Review of Systems   Other than as noted above, the patient denies any of the following symptoms: Systemic:  No fevers chills, arthralgias, or adenopathy. GI:  No abdominal pain, nausea or vomiting. GU:  No dysuria, penile pain, discharge, itching, dysuria, genital lesions, testicular pain or swelling. Skin:  No rash or itching.  PMFSH   Past medical history, family history, social history, meds, and allergies were reviewed. He is allergic to penicillin.  Physical Examination    Vital signs:  BP 152/97  Pulse 79  Temp(Src) 98.3 F (36.8 C) (Oral)  Resp 16  SpO2 97% Gen:  Alert, oriented, in no distress. Abdomen:  Soft and flat, non-distended, and non-tender.  No organomegaly or mass. Genital:  There is a fine, maculopapular rash on the ventral aspect of the penis extending onto the scrotum. This is associated with hypopigmentation. There no ulcerations or blisters. No urethral discharge. Testes are normal. No inguinal lymphadenopathy. Skin:  Warm and dry.  No rash.    Assessment   The encounter diagnosis was Contact dermatitis.  No evidence of STDs.  Plan    1.  Meds:  The following meds were prescribed:   Discharge Medication List as of 09/26/2013  3:01 PM    START taking these medications   Details  hydrocortisone cream 1 % Apply to affected area 2 times daily, Normal    predniSONE (DELTASONE) 20 MG  tablet Take 1 tablet (20 mg total) by mouth 2 (two) times daily., Starting 09/26/2013, Until Discontinued, Normal        2.  Patient Education/Counseling:  The patient was given appropriate handouts, self care instructions, and instructed in symptomatic relief.The patient was instructed to inform all sexual contacts, avoid intercourse completely for 2 weeks and then only with a condom.  The patient was told that we would call about all abnormal lab results, and that we would need to report certain kinds of infection to the health department.  Avoid intercourse for the next week.  3.  Follow up:  The patient was told to follow up here if no better in 3 to 4 days, or sooner if becoming worse in any way, and given some red flag symptoms such as fever, pain, or difficulty urinating which would prompt immediate return.  If no improvement within one to 2 weeks, see Dr. Para SkeansFred Lupton.     Carl Likesavid C Camree Wigington, MD 09/26/13 [phone]

## 2013-09-30 ENCOUNTER — Encounter (HOSPITAL_BASED_OUTPATIENT_CLINIC_OR_DEPARTMENT_OTHER): Payer: Self-pay | Admitting: Emergency Medicine

## 2013-09-30 ENCOUNTER — Emergency Department (HOSPITAL_BASED_OUTPATIENT_CLINIC_OR_DEPARTMENT_OTHER)
Admission: EM | Admit: 2013-09-30 | Discharge: 2013-09-30 | Disposition: A | Discharge status: Home / Self Care | Payer: Self-pay | Attending: Emergency Medicine | Admitting: Emergency Medicine

## 2013-09-30 DIAGNOSIS — B356 Tinea cruris: Secondary | ICD-10-CM | POA: Insufficient documentation

## 2013-09-30 DIAGNOSIS — F17200 Nicotine dependence, unspecified, uncomplicated: Secondary | ICD-10-CM | POA: Insufficient documentation

## 2013-09-30 DIAGNOSIS — Z880 Allergy status to penicillin: Secondary | ICD-10-CM | POA: Insufficient documentation

## 2013-09-30 DIAGNOSIS — M2550 Pain in unspecified joint: Secondary | ICD-10-CM | POA: Insufficient documentation

## 2013-09-30 DIAGNOSIS — IMO0002 Reserved for concepts with insufficient information to code with codable children: Secondary | ICD-10-CM | POA: Insufficient documentation

## 2013-09-30 DIAGNOSIS — Z79899 Other long term (current) drug therapy: Secondary | ICD-10-CM | POA: Insufficient documentation

## 2013-09-30 LAB — CBC WITH DIFFERENTIAL/PLATELET
Basophils Absolute: 0 10*3/uL (ref 0.0–0.1)
Basophils Relative: 0 % (ref 0–1)
Eosinophils Absolute: 0.1 10*3/uL (ref 0.0–0.7)
Eosinophils Relative: 1 % (ref 0–5)
HCT: 46.3 % (ref 39.0–52.0)
Hemoglobin: 15.9 g/dL (ref 13.0–17.0)
LYMPHS ABS: 3 10*3/uL (ref 0.7–4.0)
LYMPHS PCT: 28 % (ref 12–46)
MCH: 30.8 pg (ref 26.0–34.0)
MCHC: 34.3 g/dL (ref 30.0–36.0)
MCV: 89.6 fL (ref 78.0–100.0)
Monocytes Absolute: 1.2 10*3/uL — ABNORMAL HIGH (ref 0.1–1.0)
Monocytes Relative: 11 % (ref 3–12)
NEUTROS PCT: 60 % (ref 43–77)
Neutro Abs: 6.5 10*3/uL (ref 1.7–7.7)
PLATELETS: 178 10*3/uL (ref 150–400)
RBC: 5.17 MIL/uL (ref 4.22–5.81)
RDW: 13.9 % (ref 11.5–15.5)
WBC: 10.9 10*3/uL — AB (ref 4.0–10.5)

## 2013-09-30 LAB — COMPREHENSIVE METABOLIC PANEL
ALK PHOS: 76 U/L (ref 39–117)
ALT: 22 U/L (ref 0–53)
AST: 16 U/L (ref 0–37)
Albumin: 3.7 g/dL (ref 3.5–5.2)
Anion gap: 12 (ref 5–15)
BUN: 15 mg/dL (ref 6–23)
CO2: 24 meq/L (ref 19–32)
Calcium: 9.3 mg/dL (ref 8.4–10.5)
Chloride: 106 mEq/L (ref 96–112)
Creatinine, Ser: 1.4 mg/dL — ABNORMAL HIGH (ref 0.50–1.35)
GFR calc non Af Amer: 64 mL/min — ABNORMAL LOW (ref >90)
GFR, EST AFRICAN AMERICAN: 74 mL/min — AB (ref >90)
GLUCOSE: 111 mg/dL — AB (ref 70–99)
POTASSIUM: 3.9 meq/L (ref 3.7–5.3)
SODIUM: 142 meq/L (ref 137–147)
TOTAL PROTEIN: 6.6 g/dL (ref 6.0–8.3)
Total Bilirubin: 0.3 mg/dL (ref 0.3–1.2)

## 2013-09-30 MED ORDER — CLOTRIMAZOLE 1 % EX CREA: TOPICAL_CREAM | CUTANEOUS | Status: DC

## 2013-09-30 MED ORDER — HYDROCODONE-ACETAMINOPHEN 5-325 MG PO TABS: 1.0000 | ORAL_TABLET | ORAL | Status: DC | PRN

## 2013-09-30 NOTE — ED Notes (Addendum)
C/o "pain in all my joints" started 2pm yesterday-states his legs made him fall yesterday-also concerned with no improvement to "dermatitis" to genital area-slow steady gait to triage

## 2013-09-30 NOTE — Discharge Instructions (Signed)
Stop taking your prednisone. Follow up for continued or worsening symptoms Musculoskeletal Pain Musculoskeletal pain is muscle and boney aches and pains. These pains can occur in any part of the body. Your caregiver may treat you without knowing the cause of the pain. They may treat you if blood or urine tests, X-rays, and other tests were normal.  CAUSES There is often not a definite cause or reason for these pains. These pains may be caused by a type of germ (virus). The discomfort may also come from overuse. Overuse includes working out too hard when your body is not fit. Boney aches also come from weather changes. Bone is sensitive to atmospheric pressure changes. HOME CARE INSTRUCTIONS   Ask when your test results will be ready. Make sure you get your test results.  Only take over-the-counter or prescription medicines for pain, discomfort, or fever as directed by your caregiver. If you were given medications for your condition, do not drive, operate machinery or power tools, or sign legal documents for 24 hours. Do not drink alcohol. Do not take sleeping pills or other medications that may interfere with treatment.  Continue all activities unless the activities cause more pain. When the pain lessens, slowly resume normal activities. Gradually increase the intensity and duration of the activities or exercise.  During periods of severe pain, bed rest may be helpful. Lay or sit in any position that is comfortable.  Putting ice on the injured area.  Put ice in a bag.  Place a towel between your skin and the bag.  Leave the ice on for 15 to 20 minutes, 3 to 4 times a day.  Follow up with your caregiver for continued problems and no reason can be found for the pain. If the pain becomes worse or does not go away, it may be necessary to repeat tests or do additional testing. Your caregiver may need to look further for a possible cause. SEEK IMMEDIATE MEDICAL CARE IF:  You have pain that is  getting worse and is not relieved by medications.  You develop chest pain that is associated with shortness or breath, sweating, feeling sick to your stomach (nauseous), or throw up (vomit).  Your pain becomes localized to the abdomen.  You develop any new symptoms that seem different or that concern you. MAKE SURE YOU:   Understand these instructions.  Will watch your condition.  Will get help right away if you are not doing well or get worse. Document Released: 03/12/2005 Document Revised: 06/04/2011 Document Reviewed: 11/14/2012 [facility] Patient Information 2015 [place], [place]. This information is not intended to replace advice given to you by your health care provider. Make sure you discuss any questions you have with your health care provider.  Jock Itch Jock itch is a germ infection of the groin and upper thighs. The type of germ that causes jock itch is a fungus. It is itchy and often feels like it is burning. It is common in people who play sports. Sweating and wearing certain athletic gear can cause this type of rash. HOME CARE  Take your medicines as told. Finish them even if you start to feel better.  Wear loose-fitting clothing.  Men should wear cotton boxer shorts.  Women should wear cotton underwear.  Avoid hot baths.  Dry the groin area well after bathing. GET HELP RIGHT AWAY IF:   Your rash is worse or spreading.  Your rash returns after treatment is finished.  Your rash is not gone in 4 weeks.  The area becomes red, warm, tender, and puffy (swollen).  You have a fever. MAKE SURE YOU:  Understand these instructions.  Will watch your condition.  Will get help right away if you are not doing well or get worse. Document Released: 06/06/2009 Document Revised: 06/04/2011 Document Reviewed: 06/06/2009 [facility] Patient Information 2015 [place], [place]. This information is not intended to replace advice given to you by your health care provider. Make sure  you discuss any questions you have with your health care provider.

## 2013-09-30 NOTE — ED Provider Notes (Signed)
CSN: [id number]     Arrival date & time 09/30/13  1748 History   First MD Initiated Contact with Patient 09/30/13 1803     Chief Complaint  Patient presents with  . Joint Pain     (Consider location/radiation/quality/duration/timing/severity/associated sxs/prior Treatment) HPI Comments: Pt states that he is having generalized joint pain that started yesterday. Denies fever, redness or swelling or joints. Pt states that he started taking prednisone for dermatitis in his groin and the symptoms aren't getting any better.  The history is provided by the patient. No language interpreter was used.    Past Medical History  Diagnosis Date  . Torsion of testicle    History reviewed. No pertinent past surgical history. No family history on file. History  Substance Use Topics  . Smoking status: Current Every Day Smoker -- 0.50 packs/day  . Smokeless tobacco: Not on file  . Alcohol Use: Yes    Review of Systems  Constitutional: Negative.   Respiratory: Negative.   Cardiovascular: Negative.       Allergies  Penicillins  Home Medications   Prior to Admission medications   Medication Sig Start Date End Date Taking? Authorizing Provider  cyclobenzaprine (FLEXERIL) 10 MG tablet Take 1 tablet (10 mg total) by mouth 3 (three) times daily as needed for muscle spasms. 08/01/13   Charles B. Bernette MayersSheldon, MD  hydrocortisone cream 1 % Apply to affected area 2 times daily 09/26/13   Reuben Likesavid C Keller, MD  ibuprofen (ADVIL,MOTRIN) 800 MG tablet Take 1 tablet (800 mg total) by mouth every 8 (eight) hours as needed. 08/01/13   Charles B. Bernette MayersSheldon, MD  predniSONE (DELTASONE) 20 MG tablet Take 1 tablet (20 mg total) by mouth 2 (two) times daily. 09/26/13   Reuben Likesavid C Keller, MD   BP 144/92  Pulse 90  Temp(Src) 98.1 F (36.7 C) (Oral)  Resp 16  Ht 6\' 1"  (1.854 m)  Wt 220 lb (99.791 kg)  BMI 29.03 kg/m2  SpO2 100% Physical Exam  Constitutional: He is oriented to person, place, and time. He appears  well-developed and well-nourished.  HENT:  Head: Normocephalic and atraumatic.  Eyes: Conjunctivae and EOM are normal. Pupils are equal, round, and reactive to light.  Cardiovascular: Normal rate and regular rhythm.   Pulmonary/Chest: Effort normal and breath sounds normal.  Abdominal: Soft. Bowel sounds are normal. There is no tenderness.  Musculoskeletal: Normal range of motion.  Neurological: He is alert and oriented to person, place, and time. Coordination normal.  Skin: Skin is warm and dry.  erythematous scaly are to the scrotum  Psychiatric: He has a normal mood and affect.    ED Course  Procedures (including critical care time) Labs Review Labs Reviewed  CBC WITH DIFFERENTIAL - Abnormal; Notable for the following:    WBC 10.9 (*)    Monocytes Absolute 1.2 (*)    All other components within normal limits  COMPREHENSIVE METABOLIC PANEL - Abnormal; Notable for the following:    Glucose, Bld 111 (*)    Creatinine, Ser 1.40 (*)    GFR calc non Af Amer 64 (*)    GFR calc Af Amer 74 (*)    All other components within normal limits    Imaging Review No results found.   EKG Interpretation None      MDM   Final diagnoses:  Joint pain  Jock itch    Likely related to prednisone. Instructed pt to stop prednisone. Pt given lotrimin for rash. Pt given hydrocodone for pain  Teressa LowerVrinda Shreyas Piatkowski, NP 09/30/13 1941

## 2013-09-30 NOTE — ED Provider Notes (Signed)
Medical screening examination/treatment/procedure(s) were performed by non-physician practitioner and as supervising physician I was immediately available for consultation/collaboration.   EKG Interpretation None        Candyce ChurnJohn David Jonel Sick III, MD 09/30/13 2358

## 2014-02-22 ENCOUNTER — Emergency Department (HOSPITAL_BASED_OUTPATIENT_CLINIC_OR_DEPARTMENT_OTHER)
Admission: EM | Admit: 2014-02-22 | Discharge: 2014-02-22 | Disposition: A | Discharge status: Home / Self Care | Payer: Self-pay | Attending: Emergency Medicine | Admitting: Emergency Medicine

## 2014-02-22 ENCOUNTER — Encounter (HOSPITAL_BASED_OUTPATIENT_CLINIC_OR_DEPARTMENT_OTHER): Payer: Self-pay | Admitting: *Deleted

## 2014-02-22 DIAGNOSIS — Z8619 Personal history of other infectious and parasitic diseases: Secondary | ICD-10-CM | POA: Insufficient documentation

## 2014-02-22 DIAGNOSIS — Z720 Tobacco use: Secondary | ICD-10-CM | POA: Insufficient documentation

## 2014-02-22 DIAGNOSIS — R369 Urethral discharge, unspecified: Secondary | ICD-10-CM | POA: Insufficient documentation

## 2014-02-22 DIAGNOSIS — Z880 Allergy status to penicillin: Secondary | ICD-10-CM | POA: Insufficient documentation

## 2014-02-22 DIAGNOSIS — Z87438 Personal history of other diseases of male genital organs: Secondary | ICD-10-CM | POA: Insufficient documentation

## 2014-02-22 LAB — URINALYSIS, ROUTINE W REFLEX MICROSCOPIC
BILIRUBIN URINE: NEGATIVE
GLUCOSE, UA: NEGATIVE mg/dL
HGB URINE DIPSTICK: NEGATIVE
Ketones, ur: NEGATIVE mg/dL
Leukocytes, UA: NEGATIVE
Nitrite: NEGATIVE
Protein, ur: NEGATIVE mg/dL
SPECIFIC GRAVITY, URINE: 1.022 (ref 1.005–1.030)
UROBILINOGEN UA: 1 mg/dL (ref 0.0–1.0)
pH: 7.5 (ref 5.0–8.0)

## 2014-02-22 NOTE — ED Provider Notes (Signed)
CSN: [id number]     Arrival date & time 02/22/14  1451 History   First MD Initiated Contact with Patient 02/22/14 1527     Chief Complaint  Patient presents with  . SEXUALLY TRANSMITTED DISEASE     (Consider location/radiation/quality/duration/timing/severity/associated sxs/prior Treatment) HPI Comments: Pt comes in today with c/o continued vaginal discharge. Pt states that he was diagnosed and treated for chlamydia last month. Returned to have it rechecked 2 weeks later and was still positive. Pt states that he was then rechecked and the symptoms resolved. Pt states that he thinks that he is having some "slight penile discharge" at this time. No fever or abdominal pain  The history is provided by the patient. No language interpreter was used.    Past Medical History  Diagnosis Date  . Torsion of testicle    History reviewed. No pertinent past surgical history. History reviewed. No pertinent family history. History  Substance Use Topics  . Smoking status: Current Every Day Smoker -- 0.50 packs/day  . Smokeless tobacco: Not on file  . Alcohol Use: Yes    Review of Systems  All other systems reviewed and are negative.     Allergies  Penicillins  Home Medications   Prior to Admission medications   Not on File   BP 150/103 mmHg  Pulse 100  Temp(Src) 97.9 F (36.6 C) (Oral)  Resp 18  Ht 6\' 2"  (1.88 m)  Wt 225 lb (102.059 kg)  BMI 28.88 kg/m2  SpO2 100% Physical Exam  Constitutional: He is oriented to person, place, and time. He appears well-developed and well-nourished.  Cardiovascular: Normal rate and regular rhythm.   Pulmonary/Chest: Effort normal and breath sounds normal.  Abdominal: Bowel sounds are normal. There is no tenderness.  Genitourinary:  No penile discharge noted  Musculoskeletal: Normal range of motion.  Neurological: He is alert and oriented to person, place, and time.  Skin: Skin is warm.  Nursing note and vitals reviewed.   ED Course   Procedures (including critical care time) Labs Review Labs Reviewed  GC/CHLAMYDIA PROBE AMP  URINALYSIS, ROUTINE W REFLEX MICROSCOPIC    Imaging Review No results found.   EKG Interpretation None      MDM   Final diagnoses:  History of penile discharge    No infection noted in urine. Culture sent  For std. Exam noted consistent with std.    Teressa LowerVrinda Luddie Boghosian, NP 02/22/14 1655  Ethelda ChickMartha K Linker, MD 02/23/14 [phone]

## 2014-02-22 NOTE — ED Notes (Signed)
Treated for STDs, reports that he continues to have penile discharge.

## 2014-02-22 NOTE — ED Notes (Signed)
np at bedside

## 2014-02-23 LAB — GC/CHLAMYDIA PROBE AMP
CT Probe RNA: NEGATIVE
GC PROBE AMP APTIMA: NEGATIVE

## 2014-03-29 ENCOUNTER — Encounter (HOSPITAL_COMMUNITY): Payer: Self-pay | Admitting: Emergency Medicine

## 2014-03-29 ENCOUNTER — Emergency Department (HOSPITAL_COMMUNITY)
Admission: EM | Admit: 2014-03-29 | Discharge: 2014-03-29 | Disposition: A | Discharge status: Home / Self Care | Payer: Self-pay | Attending: Emergency Medicine | Admitting: Emergency Medicine

## 2014-03-29 DIAGNOSIS — Z880 Allergy status to penicillin: Secondary | ICD-10-CM | POA: Insufficient documentation

## 2014-03-29 DIAGNOSIS — Z720 Tobacco use: Secondary | ICD-10-CM | POA: Insufficient documentation

## 2014-03-29 DIAGNOSIS — Z87448 Personal history of other diseases of urinary system: Secondary | ICD-10-CM | POA: Insufficient documentation

## 2014-03-29 DIAGNOSIS — M79674 Pain in right toe(s): Secondary | ICD-10-CM | POA: Insufficient documentation

## 2014-03-29 MED ORDER — HYDROCODONE-ACETAMINOPHEN 5-325 MG PO TABS: 1.0000 | ORAL_TABLET | Freq: Four times a day (QID) | ORAL | Status: DC | PRN

## 2014-03-29 MED ORDER — IBUPROFEN 600 MG PO TABS: 600.0000 mg | ORAL_TABLET | Freq: Three times a day (TID) | ORAL | Status: DC | PRN

## 2014-03-29 MED ORDER — SULFAMETHOXAZOLE-TRIMETHOPRIM 800-160 MG PO TABS: 1.0000 | ORAL_TABLET | Freq: Two times a day (BID) | ORAL | Status: DC

## 2014-03-29 NOTE — ED Notes (Signed)
Pt reports to ED for pain and edema to right little toe onset Saturday. Toe is warm, sensation and motor function intact. Pedal pulses 2+. Pt denies trauma, denies DM.

## 2014-03-29 NOTE — ED Provider Notes (Signed)
CSN: [id number]     Arrival date & time 03/29/14  0900 History   First MD Initiated Contact with Patient 03/29/14 [phone]     Chief Complaint  Patient presents with  . Toe Pain      The history is provided by the patient.   Patient states worsening pain in his right little toe over the past 3 days.  He feels like now there is a small amount of redness and swelling associated with it.  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  He's never had a history of gout.  He denies fevers or chills.  No spreading redness.  His pain is mild to moderate in severity.  He does not remember any injury to his right little toe.   Past Medical History  Diagnosis Date  . Torsion of testicle    History reviewed. No pertinent past surgical history. History reviewed. No pertinent family history. History  Substance Use Topics  . Smoking status: Current Every Day Smoker -- 0.50 packs/day  . Smokeless tobacco: Not on file  . Alcohol Use: Yes    Review of Systems  All other systems reviewed and are negative.     Allergies  Penicillins  Home Medications   Prior to Admission medications   Medication Sig Start Date End Date Taking? Authorizing Provider  HYDROcodone-acetaminophen (NORCO/VICODIN) 5-325 MG per tablet Take 1 tablet by mouth every 6 (six) hours as needed for moderate pain. 03/29/14   Lyanne Co, MD  ibuprofen (ADVIL,MOTRIN) 600 MG tablet Take 1 tablet (600 mg total) by mouth every 8 (eight) hours as needed. 03/29/14   Lyanne Co, MD  sulfamethoxazole-trimethoprim (SEPTRA DS) 800-160 MG per tablet Take 1 tablet by mouth 2 (two) times daily. 03/29/14   Lyanne Co, MD   BP 139/99 mmHg  Pulse 78  Temp(Src) 97.8 F (36.6 C) (Oral)  Resp 16  SpO2 97% Physical Exam  Constitutional: He is oriented to person, place, and time. He appears well-developed and well-nourished.  HENT:  Head: Normocephalic.  Eyes: EOM are normal.  Neck: Normal range of motion.  Pulmonary/Chest: Effort  normal.  Abdominal: He exhibits no distension.  Musculoskeletal: Normal range of motion.  Mild erythema, swelling, tenderness of the right little toe.  Obvious fungal infection of his nail of his right little toe.  No fluctuance.  No spreading of the erythema or swelling past the level of his MTP joint  Neurological: He is alert and oriented to person, place, and time.  Psychiatric: He has a normal mood and affect.  Nursing note and vitals reviewed.   ED Course  Procedures (including critical care time) Labs Review Labs Reviewed - No data to display  Imaging Review No results found.   EKG Interpretation None      MDM   Final diagnoses:  Toe pain, right   suspect mild developing cellulitis.  Could represent gout.  Patient be placed on Bactrim.  No obvious fluctuance at this time to suggest need for incision and drainage.  Patient has been instructed to return to the ER for any new or worsening symptoms.    Lyanne Co, MD 03/29/14 [phone]

## 2014-03-29 NOTE — ED Notes (Signed)
MD at bedside, will triage patient after MD completes initial pt interview.

## 2014-05-07 ENCOUNTER — Encounter (HOSPITAL_BASED_OUTPATIENT_CLINIC_OR_DEPARTMENT_OTHER): Payer: Self-pay | Admitting: *Deleted

## 2014-05-07 ENCOUNTER — Emergency Department (HOSPITAL_BASED_OUTPATIENT_CLINIC_OR_DEPARTMENT_OTHER)
Admission: EM | Admit: 2014-05-07 | Discharge: 2014-05-07 | Disposition: A | Discharge status: Home / Self Care | Payer: 59 | Attending: Emergency Medicine | Admitting: Emergency Medicine

## 2014-05-07 ENCOUNTER — Emergency Department (HOSPITAL_BASED_OUTPATIENT_CLINIC_OR_DEPARTMENT_OTHER): Payer: 59

## 2014-05-07 DIAGNOSIS — Z792 Long term (current) use of antibiotics: Secondary | ICD-10-CM | POA: Insufficient documentation

## 2014-05-07 DIAGNOSIS — M79674 Pain in right toe(s): Secondary | ICD-10-CM | POA: Diagnosis present

## 2014-05-07 DIAGNOSIS — M79676 Pain in unspecified toe(s): Secondary | ICD-10-CM

## 2014-05-07 DIAGNOSIS — Z720 Tobacco use: Secondary | ICD-10-CM | POA: Insufficient documentation

## 2014-05-07 DIAGNOSIS — Z87438 Personal history of other diseases of male genital organs: Secondary | ICD-10-CM | POA: Insufficient documentation

## 2014-05-07 DIAGNOSIS — Z880 Allergy status to penicillin: Secondary | ICD-10-CM | POA: Insufficient documentation

## 2014-05-07 LAB — BASIC METABOLIC PANEL
ANION GAP: 7 (ref 5–15)
BUN: 13 mg/dL (ref 6–23)
CALCIUM: 8.9 mg/dL (ref 8.4–10.5)
CO2: 23 mmol/L (ref 19–32)
Chloride: 107 mmol/L (ref 96–112)
Creatinine, Ser: 1.24 mg/dL (ref 0.50–1.35)
GFR, EST AFRICAN AMERICAN: 85 mL/min — AB (ref >90)
GFR, EST NON AFRICAN AMERICAN: 73 mL/min — AB (ref >90)
Glucose, Bld: 88 mg/dL (ref 70–99)
Potassium: 3.8 mmol/L (ref 3.5–5.1)
SODIUM: 137 mmol/L (ref 135–145)

## 2014-05-07 LAB — CBG MONITORING, ED: Glucose-Capillary: 68 mg/dL — ABNORMAL LOW (ref 70–99)

## 2014-05-07 LAB — CBC WITH DIFFERENTIAL/PLATELET
BASOS PCT: 0 % (ref 0–1)
Basophils Absolute: 0 10*3/uL (ref 0.0–0.1)
Eosinophils Absolute: 0.2 10*3/uL (ref 0.0–0.7)
Eosinophils Relative: 3 % (ref 0–5)
HEMATOCRIT: 51.1 % (ref 39.0–52.0)
HEMOGLOBIN: 17.1 g/dL — AB (ref 13.0–17.0)
LYMPHS PCT: 31 % (ref 12–46)
Lymphs Abs: 2.2 10*3/uL (ref 0.7–4.0)
MCH: 29.3 pg (ref 26.0–34.0)
MCHC: 33.5 g/dL (ref 30.0–36.0)
MCV: 87.7 fL (ref 78.0–100.0)
MONO ABS: 1 10*3/uL (ref 0.1–1.0)
MONOS PCT: 14 % — AB (ref 3–12)
NEUTROS ABS: 3.7 10*3/uL (ref 1.7–7.7)
Neutrophils Relative %: 52 % (ref 43–77)
Platelets: 214 10*3/uL (ref 150–400)
RBC: 5.83 MIL/uL — ABNORMAL HIGH (ref 4.22–5.81)
RDW: 14.6 % (ref 11.5–15.5)
WBC: 7.1 10*3/uL (ref 4.0–10.5)

## 2014-05-07 MED ORDER — IBUPROFEN 600 MG PO TABS: 600.0000 mg | ORAL_TABLET | Freq: Four times a day (QID) | ORAL | Status: DC | PRN

## 2014-05-07 MED ORDER — SULFAMETHOXAZOLE-TRIMETHOPRIM 800-160 MG PO TABS: 1.0000 | ORAL_TABLET | Freq: Two times a day (BID) | ORAL | Status: DC

## 2014-05-07 MED ORDER — CEPHALEXIN 500 MG PO CAPS: 500.0000 mg | ORAL_CAPSULE | Freq: Four times a day (QID) | ORAL | Status: DC

## 2014-05-07 MED ORDER — OXYCODONE-ACETAMINOPHEN 5-325 MG PO TABS: 1.0000 | ORAL_TABLET | ORAL | Status: DC | PRN

## 2014-05-07 NOTE — Discharge Instructions (Signed)
Ibuprofen for pain. Percocet for severe pain. Keflex and sptra for infection. It is very important that you follow up next week for recheck with either urgent care or PCP. Also try to get in and follow up with orthopedics specialist next week. Return if worsening or high fever.

## 2014-05-07 NOTE — ED Provider Notes (Signed)
CSN: [id number]     Arrival date & time 05/07/14  1848 History   First MD Initiated Contact with Patient 05/07/14 1908     Chief Complaint  Patient presents with  . Toe Pain     (Consider location/radiation/quality/duration/timing/severity/associated sxs/prior Treatment) HPI Carl Small is a 39 y.o. male with no medical problems, presents to emergency department complaining of right fifth toe pain for several days. Patient reports similar pain in the past, states was seen here for the same and was told it may be gout and was supposed to follow-up, however he did not. Patient states at that time they placed him on antibiotic and pain medications and he states that's what made his pain better. Patient denies any toe injuries in the past. This time he states pain started approximately 3 days ago. Pain is severe. Worsened with touching of the toe and movement of the toe. He states he has really bad fungal toenail infection, he was seen by a podiatrist but was unable to follow-up and afford medications that he was prescribed. He is not diabetic. He denies any history of major infections. He denies any fever or chills. No history of gout. Has been taking over-the-counter medications with no improvement.  Past Medical History  Diagnosis Date  . Torsion of testicle    History reviewed. No pertinent past surgical history. No family history on file. History  Substance Use Topics  . Smoking status: Current Every Day Smoker -- 0.50 packs/day  . Smokeless tobacco: Not on file  . Alcohol Use: Yes    Review of Systems  Constitutional: Negative for fever and chills.  Respiratory: Negative for cough, chest tightness and shortness of breath.   Cardiovascular: Negative for chest pain, palpitations and leg swelling.  Musculoskeletal: Positive for joint swelling and arthralgias. Negative for neck pain and neck stiffness.  Skin: Negative for rash.  Allergic/Immunologic: Negative for immunocompromised  state.  Neurological: Negative for numbness.  All other systems reviewed and are negative.     Allergies  Penicillins  Home Medications   Prior to Admission medications   Medication Sig Start Date End Date Taking? Authorizing Provider  HYDROcodone-acetaminophen (NORCO/VICODIN) 5-325 MG per tablet Take 1 tablet by mouth every 6 (six) hours as needed for moderate pain. 03/29/14   Lyanne CoKevin M Campos, MD  ibuprofen (ADVIL,MOTRIN) 600 MG tablet Take 1 tablet (600 mg total) by mouth every 8 (eight) hours as needed. 03/29/14   Lyanne CoKevin M Campos, MD  sulfamethoxazole-trimethoprim (SEPTRA DS) 800-160 MG per tablet Take 1 tablet by mouth 2 (two) times daily. 03/29/14   Lyanne CoKevin M Campos, MD   BP 142/98 mmHg  Pulse 75  Temp(Src) 97.9 F (36.6 C) (Oral)  Resp 18  Ht 6\' 2"  (1.88 m)  Wt 225 lb (102.059 kg)  BMI 28.88 kg/m2  SpO2 100% Physical Exam  Constitutional: He is oriented to person, place, and time. He appears well-developed and well-nourished. No distress.  HENT:  Head: Normocephalic and atraumatic.  Eyes: Conjunctivae are normal.  Neck: Neck supple.  Cardiovascular: Normal rate, regular rhythm and normal heart sounds.   Pulmonary/Chest: Effort normal. No respiratory distress. He has no wheezes. He has no rales.  Abdominal: Soft. Bowel sounds are normal. He exhibits no distension. There is no tenderness. There is no rebound.  Musculoskeletal: He exhibits no edema.  Onychomycosis of toenails bilaterally. Right fifth toe is swollen, diffusely tender to palpation. Cap refill less than 2 seconds distally. There is a small scabbing wound between fourth  and fifth toes. No drainage.  Neurological: He is alert and oriented to person, place, and time.  Skin: Skin is warm and dry.  Nursing note and vitals reviewed.   ED Course  Procedures (including critical care time) Labs Review Labs Reviewed  CBC WITH DIFFERENTIAL/PLATELET - Abnormal; Notable for the following:    RBC 5.83 (*)    Hemoglobin 17.1  (*)    Monocytes Relative 14 (*)    All other components within normal limits  BASIC METABOLIC PANEL    Imaging Review Dg Toe 5th Right  05/07/2014   CLINICAL DATA:  Fifth toe pain for 3 days.  No trauma history.  EXAM: RIGHT FIFTH TOE  COMPARISON:  None.  FINDINGS: Frontal, oblique, and lateral views were obtained. There is apparent remodeling in the fifth middle phalanx region. There is no ossified fifth distal phalanx. There is rudimentary soft tissue in this area. There is no acute fracture or dislocation. No erosive change or bony destruction. Joint spaces appear intact.  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 Remodeling is noted in the fifth middle phalanx. Question residua of distant trauma, possibly extending to infancy. No acute fracture or dislocation. No bony destruction or erosion. Rudimentary soft tissue of fifth digit present.   Electronically Signed   By: Bretta Bang III M.D.   On: 05/07/2014 20:24     EKG Interpretation None      MDM   Final diagnoses:  Toe pain    Patient with recurrent right fifth toe pain.    no injuries. Toe is slightly swollen, no erythema, exquisite tenderness to palpation. Patient has sensitivity and pain with even the lightest touch. Prior visit for similar pain, patient was treated with Bactrim and pain medications and states pain improved. Concern for possible toe infection versus gout versus injury. Will x-ray, labs ordered.  9:50 PM Patient's x-rays as described above. He denies knowing of any injuries to that toe. At this time, no evidence of major infection, will start on Keflex and Bactrim, pain medications, follow-up with orthopedic specialist. Still suspect infection versus gout. Return precautions discussed.  Filed Vitals:   05/07/14 1853  BP: 142/98  Pulse: 75  Temp: 97.9 F (36.6 C)  TempSrc: Oral  Resp: 18  Height:  (1.88 m)  Weight: 225 lb (102.059 kg)  SpO2: 100%     Lottie Mussel, PA-C 05/07/14 2151  Gwyneth Sprout, MD 05/07/14 2254

## 2014-05-07 NOTE — ED Notes (Signed)
Pt c/o pain in his right 5th toe without injury x 2-3 days.

## 2014-06-14 ENCOUNTER — Ambulatory Visit (INDEPENDENT_AMBULATORY_CARE_PROVIDER_SITE_OTHER): Payer: 59 | Admitting: Emergency Medicine

## 2014-06-14 VITALS — BP 144/110 | HR 75 | Temp 97.7°F | Resp 18 | Ht 72.5 in | Wt 235.2 lb

## 2014-06-14 DIAGNOSIS — I10 Essential (primary) hypertension: Secondary | ICD-10-CM

## 2014-06-14 DIAGNOSIS — L609 Nail disorder, unspecified: Secondary | ICD-10-CM

## 2014-06-14 DIAGNOSIS — M2540 Effusion, unspecified joint: Secondary | ICD-10-CM | POA: Diagnosis not present

## 2014-06-14 DIAGNOSIS — L608 Other nail disorders: Secondary | ICD-10-CM | POA: Diagnosis not present

## 2014-06-14 LAB — POCT CBC
Granulocyte percent: 69 %G (ref 37–80)
HCT, POC: 52.3 % (ref 43.5–53.7)
Hemoglobin: 17.3 g/dL (ref 14.1–18.1)
LYMPH, POC: 2.5 (ref 0.6–3.4)
MCH: 29.9 pg (ref 27–31.2)
MCHC: 33.1 g/dL (ref 31.8–35.4)
MCV: 90.1 fL (ref 80–97)
MID (cbc): 0.6 (ref 0–0.9)
MPV: 7.8 fL (ref 0–99.8)
PLATELET COUNT, POC: 230 10*3/uL (ref 142–424)
POC GRANULOCYTE: 6.8 (ref 2–6.9)
POC LYMPH %: 25.2 % (ref 10–50)
POC MID %: 5.8 % (ref 0–12)
RBC: 5.8 M/uL (ref 4.69–6.13)
RDW, POC: 14.7 %
WBC: 9.8 10*3/uL (ref 4.6–10.2)

## 2014-06-14 LAB — GLUCOSE, POCT (MANUAL RESULT ENTRY): POC Glucose: 69 mg/dl — AB (ref 70–99)

## 2014-06-14 LAB — POCT GLYCOSYLATED HEMOGLOBIN (HGB A1C): HEMOGLOBIN A1C: 5.1

## 2014-06-14 LAB — POCT SEDIMENTATION RATE: POCT SED RATE: 2 mm/hr (ref 0–22)

## 2014-06-14 MED ORDER — TERBINAFINE HCL 250 MG PO TABS: 250.0000 mg | ORAL_TABLET | Freq: Every day | ORAL | Status: DC

## 2014-06-14 MED ORDER — MELOXICAM 15 MG PO TABS: 15.0000 mg | ORAL_TABLET | Freq: Every day | ORAL | Status: DC

## 2014-06-14 MED ORDER — HYDROCHLOROTHIAZIDE 25 MG PO TABS
25.0000 mg | ORAL_TABLET | Freq: Every day | ORAL | Status: DC
Start: 2014-06-14 — End: 2015-09-01

## 2014-06-14 MED ORDER — HYDROCODONE-ACETAMINOPHEN 5-325 MG PO TABS: 1.0000 | ORAL_TABLET | Freq: Four times a day (QID) | ORAL | Status: DC | PRN

## 2014-06-14 NOTE — Progress Notes (Signed)
Subjective:    Patient ID: Carl Small, male    DOB: 1976/02/07, 39 y.o.   MRN: [id number]  HPI Patient presents for 5th toe pain and swelling of the right foot. Pain and swelling have been intermittent for past 2 months, but has become more constant over the past week. Is painful to walk and bend foot. Pain does not radiate. States that toe was purple in color yesterday. Denies fever, loss of sensation/function. Toe nails have been turning black in color over the past 10 years, but is not having any pain at nails. Foot does not itch.   Has told he has hypertension in the past, but has never been on medication. Not following any particular diet and is not exercising. Joined [employer] 1 month ago, but unable to go due to toe pain. Denies SOB, CP, palpitations, HA, dizziness, or edema.   Review of Systems  Constitutional: Negative for fever and chills.  Respiratory: Negative for shortness of breath and wheezing.   Cardiovascular: Negative for chest pain, palpitations and leg swelling.  Musculoskeletal: Positive for joint swelling, arthralgias and gait problem. Negative for myalgias.  Skin: Positive for color change. Negative for pallor, rash and wound.  Neurological: Negative for dizziness and headaches.  Hematological: Negative for adenopathy.       Objective:   Physical Exam  Constitutional: He is oriented to person, place, and time. He appears well-developed and well-nourished. No distress.  Blood pressure 144/110, pulse 75, temperature 97.7 F (36.5 C), temperature source Oral, resp. rate 18, height 6' 0.5" (1.842 m), weight 235 lb 3.2 oz (106.686 kg), SpO2 98 %.  HENT:  Head: Normocephalic and atraumatic.  Right Ear: External ear normal.  Left Ear: External ear normal.  Eyes: Conjunctivae are normal. Pupils are equal, round, and reactive to light. Right eye exhibits no discharge. Left eye exhibits no discharge.  Neck: Normal range of motion. Neck supple. No thyromegaly  present.  Cardiovascular: Normal rate and normal heart sounds.  Exam reveals no gallop and no friction rub.   No murmur heard. Pulmonary/Chest: Effort normal and breath sounds normal. No respiratory distress. He has no wheezes. He has no rales.  Musculoskeletal: He exhibits no edema.  Lymphadenopathy:    He has no cervical adenopathy.  Neurological: He is alert and oriented to person, place, and time.  Skin: Skin is warm and dry. No rash noted. He is not diaphoretic. No erythema. No pallor.  Feet have dry scale bilaterally, but no erythema, crusting, or lesions. All toe nails have melanonychia.   Results for orders placed or performed in visit on 06/14/14  POCT CBC  Result Value Ref Range   WBC 9.8 4.6 - 10.2 K/uL   Lymph, poc 2.5 0.6 - 3.4   POC LYMPH PERCENT 25.2 10 - 50 %L   MID (cbc) 0.6 0 - 0.9   POC MID % 5.8 0 - 12 %M   POC Granulocyte 6.8 2 - 6.9   Granulocyte percent 69.0 37 - 80 %G   RBC 5.80 4.69 - 6.13 M/uL   Hemoglobin 17.3 14.1 - 18.1 g/dL   HCT, POC 14.7 82.9 - 53.7 %   MCV 90.1 80 - 97 fL   MCH, POC 29.9 27 - 31.2 pg   MCHC 33.1 31.8 - 35.4 g/dL   RDW, POC 56.2 %   Platelet Count, POC 230 142 - 424 K/uL   MPV 7.8 0 - 99.8 fL  POCT glucose (manual entry)  Result Value  Ref Range   POC Glucose 69 (A) 70 - 99 mg/dl  POCT glycosylated hemoglobin (Hb A1C)  Result Value Ref Range   Hemoglobin A1C 5.1   POCT SEDIMENTATION RATE  Result Value Ref Range   POCT SED RATE 2 0 - 22 mm/hr      Assessment & Plan:  1. Joint swelling X-ray from 05/07/14: no acute fractures or erosions observed. - POCT CBC - POCT glucose (manual entry) - POCT glycosylated hemoglobin (Hb A1C) - Uric Acid - POCT SEDIMENTATION RATE - Ambulatory referral to Podiatry - meloxicam (MOBIC) 15 MG tablet; Take 1 tablet (15 mg total) by mouth daily.  Dispense: 30 tablet; Refill: 1 - HYDROcodone-acetaminophen (NORCO) 5-325 MG per tablet; Take 1 tablet by mouth every 6 (six) hours as needed.   Dispense: 30 tablet; Refill: 0  2. Nail abnormality 3. Melanonychia - Fungus Culture with Smear - Ambulatory referral to Podiatry - Ambulatory referral to Dermatology - terbinafine (LAMISIL) 250 MG tablet; Take 1 tablet (250 mg total) by mouth daily.  Dispense: 30 tablet; Refill: 3  4. Essential hypertension Discussed lifestyle modifications and medication side effects. Patient states that once he gets toe pain under control will be in the gym more often. RTC in 4 weeks for bp recheck. - hydrochlorothiazide (HYDRODIURIL) 25 MG tablet; Take 1 tablet (25 mg total) by mouth daily.  Dispense: 90 tablet; Refill: 3   Delcie Ruppert PA-C  [facility] 06/14/2014 3:05 PM

## 2014-06-14 NOTE — Patient Instructions (Signed)

## 2014-06-15 LAB — URIC ACID: URIC ACID, SERUM: 6.7 mg/dL (ref 4.0–7.8)

## 2014-06-15 LAB — KOH PREP: RESULT - KOH: NONE SEEN

## 2014-06-16 ENCOUNTER — Telehealth: Payer: Self-pay

## 2014-06-16 NOTE — Telephone Encounter (Signed)
Referral can you help pt with this?

## 2014-06-16 NOTE — Telephone Encounter (Signed)
Patient is calling in regards to referral issues. [facility] is is having difficulty being seen for his pediatry appointment because the insurance doesn't have Mal Mistyishira listed with our office. Please call! [phone]

## 2014-06-28 NOTE — Telephone Encounter (Signed)
Spoke to pt, he is aware of his lab results, i explained the timing for a culture to return with results,  He understood, and will await our call with the results. We reviewed his appointment time with the podiatrist and he stated he is still miserable and will keep this appointment.

## 2014-07-01 ENCOUNTER — Ambulatory Visit (INDEPENDENT_AMBULATORY_CARE_PROVIDER_SITE_OTHER): Payer: 59

## 2014-07-01 ENCOUNTER — Encounter: Payer: Self-pay | Admitting: Podiatry

## 2014-07-01 ENCOUNTER — Ambulatory Visit (INDEPENDENT_AMBULATORY_CARE_PROVIDER_SITE_OTHER): Payer: 59 | Admitting: Podiatry

## 2014-07-01 VITALS — BP 149/86 | HR 75 | Resp 15

## 2014-07-01 DIAGNOSIS — M779 Enthesopathy, unspecified: Secondary | ICD-10-CM

## 2014-07-01 DIAGNOSIS — M79671 Pain in right foot: Secondary | ICD-10-CM

## 2014-07-01 DIAGNOSIS — M79672 Pain in left foot: Secondary | ICD-10-CM

## 2014-07-01 DIAGNOSIS — L02611 Cutaneous abscess of right foot: Secondary | ICD-10-CM

## 2014-07-01 MED ORDER — HYDROCODONE-ACETAMINOPHEN 10-325 MG PO TABS: 1.0000 | ORAL_TABLET | Freq: Three times a day (TID) | ORAL | Status: DC | PRN

## 2014-07-01 MED ORDER — LEVOFLOXACIN 500 MG PO TABS: 500.0000 mg | ORAL_TABLET | Freq: Every day | ORAL | Status: DC

## 2014-07-01 MED ORDER — CLINDAMYCIN HCL 300 MG PO CAPS: 300.0000 mg | ORAL_CAPSULE | Freq: Three times a day (TID) | ORAL | Status: DC

## 2014-07-01 NOTE — Progress Notes (Signed)
   Subjective:    Patient ID: Carl Small, male    DOB: 1975-05-28, 39 y.o.   MRN: [id number]  HPI Pt presents with bilateral aching in feet severe pain in right 5th toe, bilateral discolored nails, he is currently on lamisil started 3.21.16. Feet constantly ache and wake him up at night   Review of Systems  Musculoskeletal: Positive for myalgias.       Objective:   Physical Exam        Assessment & Plan:

## 2014-07-04 NOTE — Progress Notes (Signed)
Subjective:     Patient ID: Carl Small, male   DOB: 01-24-1976, 39 y.o.   MRN: [id number]  HPI patient presents stating he's had a lot of aching around his fifth toe of his right foot for the last few weeks. Does not remember specific injury but did have a very small cut which is healed between the fourth and fifth toes and was on a fungal and attempt at an antibiotic that he had a reaction to for his right foot   Review of Systems  All other systems reviewed and are negative.      Objective:   Physical Exam  Cardiovascular: Intact distal pulses.   Musculoskeletal: Normal range of motion.  Neurological: He is alert.  Skin: Skin is warm.  Nursing note and vitals reviewed.  neurovascular status was found to be intact with muscle strength adequate and noted and range of motion within normal limits subtalar midtarsal joint. Patient has severe for foot structural malalignment with structural bunion deformity hammertoe deformity and keratotic lesion formation with flatfoot deformity and severely thickened nailbeds 1-5 of both feet. He has persistent foot pain but mostly at this time is concerned about this fifth toe which is been very tender for the last few weeks. States he went to urgent care who noticed changes in his bone of the fifth toe but felt it was chronic and also did place him on an antibiotic which was not able to take due to  allergic reaction. 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 Patient has severe for foot structural and midfoot structural deformities for which we scanned for orthotics today and we then discussed his right fifth toe. Not C current indications of abscess  or infection but it is possible and we did place him on Cleocin 300 mg 3 times a day and Levaquin daily for treatment. I reviewed his x-rays indicating that there is complete loss of the distal phalanx but it appears to be not an acute process and I did review this with Dr. Al CorpusHyatt who agreed with me. I did explain the possibility for infection and that we may need to open this area and that also the possibility exists for amputation of the toe at one point if it turns out there is osteomyelitis. If we do not get improvement from the antibiotics we will need to get more advanced testing and possible MRI and I gave him strict instructions if there should be any redness drainage or any systemic signs of infection he is to let us know immediately and if he should show any fever or any indications of systemic infection or redness into his foot he is to go straight to the emergency room. He'll be seen back in the next several weeks and let us any issues should occur

## 2014-07-12 ENCOUNTER — Telehealth: Payer: Self-pay | Admitting: Physician Assistant

## 2014-07-12 ENCOUNTER — Ambulatory Visit: Payer: 59 | Admitting: Physician Assistant

## 2014-07-12 LAB — CULTURE, FUNGUS WITHOUT SMEAR

## 2014-07-12 NOTE — Telephone Encounter (Signed)
Entered in error

## 2014-07-12 NOTE — Telephone Encounter (Signed)
Spoke with patient regarding results and no fungus found. Podiatrist advised to continue lamisil and added levofloxacin and clindamycin. According to patient podiatrist thinks it is a bad infection. Has f/u appt in 2 weeks. Has been taking all medications as advised and are well tolerated. Toe pain has improved and is not has often, but comes with intensity. Request refill of Norco. Initially told patient I would refill medication, but appears that was just refilled by Dr. Cristie HemNorman Regal on 07/01/14. RX not sent. F/u appt 07/26/14 @ 4:00.

## 2014-07-13 NOTE — Telephone Encounter (Signed)
I have scheduled this appointment per Carl Small's request.

## 2014-07-22 ENCOUNTER — Ambulatory Visit: Payer: 59 | Admitting: Podiatry

## 2014-07-26 ENCOUNTER — Ambulatory Visit: Payer: 59 | Admitting: Physician Assistant

## 2014-07-29 ENCOUNTER — Ambulatory Visit (INDEPENDENT_AMBULATORY_CARE_PROVIDER_SITE_OTHER): Payer: 59 | Admitting: Podiatry

## 2014-07-29 ENCOUNTER — Encounter: Payer: Self-pay | Admitting: Podiatry

## 2014-07-29 VITALS — BP 146/88 | HR 78 | Resp 15

## 2014-07-29 DIAGNOSIS — M779 Enthesopathy, unspecified: Secondary | ICD-10-CM | POA: Diagnosis not present

## 2014-07-29 DIAGNOSIS — L02611 Cutaneous abscess of right foot: Secondary | ICD-10-CM

## 2014-07-29 NOTE — Patient Instructions (Signed)

## 2014-07-29 NOTE — Progress Notes (Signed)
Subjective:     Patient ID: Carl Small, male   DOB: 1975/04/02, 39 y.o.   MRN: [id number]  HPI patient states my toe is feeling quite a bit better and I'm excited about getting these orthotics for my chronic pain in my feet   Review of Systems     Objective:   Physical Exam Neurovascular status intact with diminished discomfort in the fifth digit right with no indications of drainage or excessive edema at the current time with severe flatfoot deformity also noted    Assessment:     Appears to be doing well after having antibiotics for what appeared to be some kind of a discrete infection fifth digit right with tendinitis symptoms secondary to severe foot structural issues    Plan:     H&P and conditions reviewed with patient. At this point he is done with his antibiotics and we'll just watch the toe and if it gets sore or swollen again in may require further treatment. Orthotics dispensed with instructions on usage and reappoint to recheck

## 2014-10-12 ENCOUNTER — Other Ambulatory Visit: Payer: Self-pay | Admitting: Physician Assistant

## 2015-06-20 ENCOUNTER — Emergency Department (HOSPITAL_COMMUNITY)
Admission: EM | Admit: 2015-06-20 | Discharge: 2015-06-20 | Disposition: A | Discharge status: Home / Self Care | Payer: BC Managed Care – PPO | Attending: Emergency Medicine | Admitting: Emergency Medicine

## 2015-06-20 ENCOUNTER — Ambulatory Visit (INDEPENDENT_AMBULATORY_CARE_PROVIDER_SITE_OTHER): Payer: BC Managed Care – PPO | Admitting: Emergency Medicine

## 2015-06-20 ENCOUNTER — Encounter (HOSPITAL_COMMUNITY): Payer: Self-pay | Admitting: Emergency Medicine

## 2015-06-20 VITALS — BP 142/92 | HR 87 | Temp 98.5°F | Resp 18 | Ht 72.5 in | Wt 226.0 lb

## 2015-06-20 DIAGNOSIS — F17200 Nicotine dependence, unspecified, uncomplicated: Secondary | ICD-10-CM | POA: Insufficient documentation

## 2015-06-20 DIAGNOSIS — F419 Anxiety disorder, unspecified: Secondary | ICD-10-CM | POA: Diagnosis not present

## 2015-06-20 DIAGNOSIS — R519 Headache, unspecified: Secondary | ICD-10-CM

## 2015-06-20 DIAGNOSIS — R200 Anesthesia of skin: Secondary | ICD-10-CM | POA: Diagnosis not present

## 2015-06-20 DIAGNOSIS — R079 Chest pain, unspecified: Secondary | ICD-10-CM

## 2015-06-20 DIAGNOSIS — Z880 Allergy status to penicillin: Secondary | ICD-10-CM | POA: Insufficient documentation

## 2015-06-20 DIAGNOSIS — R51 Headache: Secondary | ICD-10-CM | POA: Diagnosis not present

## 2015-06-20 DIAGNOSIS — R531 Weakness: Secondary | ICD-10-CM | POA: Diagnosis not present

## 2015-06-20 LAB — BASIC METABOLIC PANEL
ANION GAP: 8 (ref 5–15)
BUN: 10 mg/dL (ref 6–20)
CHLORIDE: 109 mmol/L (ref 101–111)
CO2: 23 mmol/L (ref 22–32)
Calcium: 9.3 mg/dL (ref 8.9–10.3)
Creatinine, Ser: 1.42 mg/dL — ABNORMAL HIGH (ref 0.61–1.24)
GFR calc non Af Amer: >60 mL/min (ref >60)
Glucose, Bld: 73 mg/dL (ref 65–99)
POTASSIUM: 4.3 mmol/L (ref 3.5–5.1)
SODIUM: 140 mmol/L (ref 135–145)

## 2015-06-20 LAB — CBC WITH DIFFERENTIAL/PLATELET
BASOS PCT: 0 %
Basophils Absolute: 0 10*3/uL (ref 0.0–0.1)
EOS ABS: 0.2 10*3/uL (ref 0.0–0.7)
Eosinophils Relative: 2 %
HCT: 48.9 % (ref 39.0–52.0)
HEMOGLOBIN: 16.9 g/dL (ref 13.0–17.0)
Lymphocytes Relative: 27 %
Lymphs Abs: 2.1 10*3/uL (ref 0.7–4.0)
MCH: 30.9 pg (ref 26.0–34.0)
MCHC: 34.6 g/dL (ref 30.0–36.0)
MCV: 89.4 fL (ref 78.0–100.0)
MONOS PCT: 9 %
Monocytes Absolute: 0.7 10*3/uL (ref 0.1–1.0)
NEUTROS PCT: 62 %
Neutro Abs: 4.8 10*3/uL (ref 1.7–7.7)
Platelets: 201 10*3/uL (ref 150–400)
RBC: 5.47 MIL/uL (ref 4.22–5.81)
RDW: 13.9 % (ref 11.5–15.5)
WBC: 7.8 10*3/uL (ref 4.0–10.5)

## 2015-06-20 MED ORDER — METOCLOPRAMIDE HCL 5 MG/ML IJ SOLN
10.0000 mg | Freq: Once | INTRAMUSCULAR | Status: AC
  Administered 2015-06-20: 10 mg via INTRAVENOUS
  Filled 2015-06-20: qty 2

## 2015-06-20 MED ORDER — KETOROLAC TROMETHAMINE 30 MG/ML IJ SOLN
30.0000 mg | Freq: Once | INTRAMUSCULAR | Status: AC
  Administered 2015-06-20: 30 mg via INTRAVENOUS
  Filled 2015-06-20: qty 1

## 2015-06-20 MED ORDER — IBUPROFEN 800 MG PO TABS: 800.0000 mg | ORAL_TABLET | Freq: Three times a day (TID) | ORAL | Status: DC

## 2015-06-20 MED ORDER — SODIUM CHLORIDE 0.9 % IV SOLN
Freq: Once | INTRAVENOUS | Status: AC
  Administered 2015-06-20: 12:00:00 via INTRAVENOUS

## 2015-06-20 MED ORDER — DEXAMETHASONE SODIUM PHOSPHATE 10 MG/ML IJ SOLN
10.0000 mg | Freq: Once | INTRAMUSCULAR | Status: AC
Start: 2015-06-20 — End: 2015-06-20
  Administered 2015-06-20: 10 mg via INTRAVENOUS
  Filled 2015-06-20: qty 1

## 2015-06-20 NOTE — Patient Instructions (Signed)
     IF you received an x-ray today, you will receive an invoice from [facility]. Please contact [facility] at [phone] with questions or concerns regarding your invoice.   IF you received labwork today, you will receive an invoice from [facility]. Please contact [facility] at [phone] with questions or concerns regarding your invoice.   Our billing staff will not be able to assist you with questions regarding bills from these companies.  You will be contacted with the lab results as soon as they are available. The fastest way to get your results is to activate your My Chart account. Instructions are located on the last page of this paperwork. If you have not heard from us regarding the results in 2 weeks, please contact this office.      

## 2015-06-20 NOTE — ED Notes (Signed)
EMS - Patient coming from [facility] with stroke like symptoms of Migraine to left side, tingling in the left arm and leg, chest wall pain that is reproduceable and seeing black spots.  Hx of migraines and HTN, ran out of BP medications at the end of August.  Vital signs: HR 80, 20 respirations, 98% room air, 150/100 BP and 85 CBG.  Patient is alert and oriented x 4.

## 2015-06-20 NOTE — Discharge Instructions (Signed)
General Headache Without Cause Follow-up with neurology. Take ibuprofen 800 mg as needed every 6 hours for headache. A headache is pain or discomfort felt around the head or neck area. There are many causes and types of headaches. In some cases, the cause may not be found.  HOME CARE  Managing Pain  Take over-the-counter and prescription medicines only as told by your doctor.  Lie down in a dark, quiet room when you have a headache.  If directed, apply ice to the head and neck area:  Put ice in a plastic bag.  Place a towel between your skin and the bag.  Leave the ice on for 20 minutes, 2-3 times per day.  Use a heating pad or hot shower to apply heat to the head and neck area as told by your doctor.  Keep lights dim if bright lights bother you or make your headaches worse. Eating and Drinking  Eat meals on a regular schedule.  Lessen how much alcohol you drink.  Lessen how much caffeine you drink, or stop drinking caffeine. General Instructions  Keep all follow-up visits as told by your doctor. This is important.  Keep a journal to find out if certain things bring on headaches. For example, write down:  What you eat and drink.  How much sleep you get.  Any change to your diet or medicines.  Relax by getting a massage or doing other relaxing activities.  Lessen stress.  Sit up straight. Do not tighten (tense) your muscles.  Do not use tobacco products. This includes cigarettes, chewing tobacco, or e-cigarettes. If you need help quitting, ask your doctor.  Exercise regularly as told by your doctor.  Get enough sleep. This often means 7-9 hours of sleep. GET HELP IF:  Your symptoms are not helped by medicine.  You have a headache that feels different than the other headaches.  You feel sick to your stomach (nauseous) or you throw up (vomit).  You have a fever. GET HELP RIGHT AWAY IF:   Your headache becomes really bad.  You keep throwing up.  You have  a stiff neck.  You have trouble seeing.  You have trouble speaking.  You have pain in the eye or ear.  Your muscles are weak or you lose muscle control.  You lose your balance or have trouble walking.  You feel like you will pass out (faint) or you pass out.  You have confusion.   This information is not intended to replace advice given to you by your health care provider. Make sure you discuss any questions you have with your health care provider.   Document Released: 12/20/2007 Document Revised: 12/01/2014 Document Reviewed: 07/05/2014 Elsevier Interactive Patient Education [employer]2016 Elsevier Inc.

## 2015-06-20 NOTE — ED Provider Notes (Signed)
Medical screening examination/treatment/procedure(s) were conducted as a shared visit with non-physician practitioner(s) and myself.  I personally evaluated the patient during the encounter.   EKG Interpretation None     40 y.o. male presents with complex migraine symptoms. No deficits. HTN is only risk factor. MSK chest pain reproducible. Will treat migraine as likely underlying cause. Plan to follow up with PCP as needed and return precautions discussed for worsening or new concerning symptoms.   See related encounter note   Lyndal Pulleyaniel Elnor Renovato, MD 06/21/15 2238

## 2015-06-20 NOTE — Progress Notes (Signed)
Patient ID: Carl Small, male   DOB: 12-Sep-1975, 40 y.o.   MRN: [id number]     By signing my name below, I, Carl Small, attest that this documentation has been prepared under the direction and in the presence of Lesle Chris, MD.  Electronically Signed: Littie Small, Medical Scribe. 06/20/2015. 10:22 AM.   Chief Complaint:  Chief Complaint  Patient presents with  . Numbness    left arm and friends, started over the weekend  . Chest Pain    HPI: Carl Small is a 40 y.o. male with a history of migraines and hypertension who reports to [facility] today complaining of sharp, left-sided chest pain that started a while ago but worsened over the past weekend. The pain is described as a 7-8/10 in severity. Patient also reports having tingling in the fingers of his left hand that started 3 days ago which progressed to left arm numbness and weakness intermittently. He also began noticing tingling and weakness in his left leg that started while at work today around 10:00 AM and did have some difficulty with ambulation as a result. He has also had a left-sided, throbbing headache that started a few weeks ago, but worsened today. Patient also reports having associated SOB. Over the weekend, his friends had told him that his speech seemed to be a bit slurred; however, he himself did not notice any speech slurring. Patient denies nausea, vomiting, and diaphoresis. He also denies history of cardiac problems, history of MI, history of CVA, illicit drug use, heavy lifting, and known injuries. He has a history of migraines but notes that he has not had a severe headache in several years. His FMHx includes MI and CVA in multiple family members, including both parents and multiple grandparents. Patient admits to smoking 5-6 cigarettes a day and states he does not drink much alcohol. He notes that he had been taking blood pressure medications previously, but he has not been taking them because he did not get them  refilled.  Past Medical History  Diagnosis Date  . Torsion of testicle    Past Surgical History  Procedure Laterality Date  . Fracture surgery    . Testicle surgery  1996   Social History   Social History  . Marital Status: Single    Spouse Name: N/A  . Number of Children: N/A  . Years of Education: N/A   Social History Main Topics  . Smoking status: Current Every Day Smoker -- 0.50 packs/day  . Smokeless tobacco: None  . Alcohol Use: 4.2 oz/week    7 Cans of beer per week  . Drug Use: No  . Sexual Activity: Yes   Other Topics Concern  . None   Social History Narrative   Family History  Problem Relation Age of Onset  . Heart disease Mother   . Hypertension Mother   . Diabetes Mother   . Epilepsy Mother    Allergies  Allergen Reactions  . Penicillins Swelling  . Sulfa Antibiotics Hives   Prior to Admission medications   Medication Sig Start Date End Date Taking? Authorizing Provider  clindamycin (CLEOCIN) 300 MG capsule Take 1 capsule (300 mg total) by mouth 3 (three) times daily. Patient not taking: Reported on 07/29/2014 07/01/14   Kirstie Peri Regal, DPM  hydrochlorothiazide (HYDRODIURIL) 25 MG tablet Take 1 tablet (25 mg total) by mouth daily. Patient not taking: Reported on 06/20/2015 06/14/14   Tishira R Brewington, PA-C  HYDROcodone-acetaminophen (NORCO) 10-325 MG per tablet Take 1  tablet by mouth every 8 (eight) hours as needed. Patient not taking: Reported on 06/20/2015 07/01/14   Lenn Sink, DPM  HYDROcodone-acetaminophen (NORCO) 5-325 MG per tablet Take 1 tablet by mouth every 6 (six) hours as needed. Patient not taking: Reported on 06/20/2015 06/14/14   Tishira R Brewington, PA-C  levofloxacin (LEVAQUIN) 500 MG tablet Take 1 tablet (500 mg total) by mouth daily. Patient not taking: Reported on 07/29/2014 07/01/14   Lenn Sink, DPM  meloxicam (MOBIC) 15 MG tablet TAKE ONE TABLET BY MOUTH ONCE DAILY Patient not taking: Reported on 06/20/2015 10/14/14   Collene Gobble, MD  terbinafine (LAMISIL) 250 MG tablet Take 1 tablet (250 mg total) by mouth daily. Patient not taking: Reported on 06/20/2015 06/14/14   Tishira R Brewington, PA-C     ROS: The patient denies fevers, chills, night sweats, unintentional weight loss, palpitations, wheezing, nausea, vomiting, abdominal pain, dysuria, hematuria, melena.  All other systems have been reviewed and were otherwise negative with the exception of those mentioned in the HPI and as above.    PHYSICAL EXAM: Filed Vitals:   06/20/15 1008  BP: 142/92  Pulse: 87  Temp: 98.5 F (36.9 C)  Resp: 18   Body mass index is 30.21 kg/(m^2).   General: Alert HEENT:  Normocephalic, atraumatic, oropharynx patent. Eye: Nonie Hoyer [place] Cardiovascular:  Regular rate and rhythm, no rubs murmurs or gallops.  No Carotid bruits, radial pulse intact. No pedal edema.  Respiratory: Clear to auscultation bilaterally.  No wheezes, rales, or rhonchi.  No cyanosis, no use of accessory musculature Abdominal: No organomegaly, abdomen is soft and non-tender, positive bowel sounds.  No masses. Musculoskeletal: Gait intact. Exquisite tenderness over left anterior chest wall. Skin: No rashes. Neurologic: Some right facial weakness. Rest of cranial nerves intact. 3-4 strength in the left arm, left leg. No weakness in right arm or right leg. Decreased sensation left leg compared to right leg. Shoulder sensory is symmetrical. Psychiatric: Patient acts appropriately throughout our interaction. Lymphatic: No cervical or submandibular lymphadenopathy     LABS:    EKG/XRAY:   Primary read interpreted by Dr. Cleta Alberts at [facility]. EKG normal.   ASSESSMENT/PLAN: Patient presents with symptoms which started on Friday with severe left-sided headache associated with weakness in the left arm. Over the weekend he also developed significant left-sided chest pain. Today at work, he had difficulty with weakness in his left leg that started approximately  10:00 AM. On examination he has weakness in the left arm and left leg. Question of mild right facial weakness. On examination, he has significant chest wall tenderness on the left. Patient sent via EMS to the hospital for evaluation.   Gross sideeffects, risk and benefits, and alternatives of medications d/w patient. Patient is aware that all medications have potential sideeffects and we are unable to predict every sideeffect or drug-drug interaction that may occur.  Lesle Chris MD 06/20/2015 10:22 AM

## 2015-06-20 NOTE — ED Provider Notes (Signed)
CSN: [id number]     Arrival date & time 06/20/15  1107 History   First MD Initiated Contact with Patient 06/20/15 1119     Chief Complaint  Patient presents with  . Migraine    (Consider location/radiation/quality/duration/timing/severity/associated sxs/prior Treatment) Patient is a 40 y.o. male presenting with Acute Neurological Problem. The history is provided by the patient. No language interpreter was used.  Cerebrovascular Accident Associated symptoms include headaches, numbness and weakness. Pertinent negatives include no chest pain, fever, nausea or vomiting.  Carl Small is a 40 y.o male with No significant past medical history who presents via EMS from [facility] with strokelike symptoms. He is complaining of a left-sided headache and tingling to the left arm for the past 3 days. He reports increased headaches in the last few weeks. He is diagnosed with migraines years ago and has not taken medication or had headaches in years. States that the headaches have not been relieved by ibuprofen and that he is seeing black spots. He also reports not taking his blood pressure medication since August of last year. On the way to the bone at urgent care he reports that his left leg became weak and he began having chest pain. He is right-handed. He states that he is scared because he has a strong family history of stroke and heart attacks.  He denies any recent illness, fever, chills, night sweats, shortness of breath, abdominal pain, nausea, vomiting, slurred speech, or blurry vision.    Past Medical History  Diagnosis Date  . Torsion of testicle    Past Surgical History  Procedure Laterality Date  . Fracture surgery    . Testicle surgery  1996   Family History  Problem Relation Age of Onset  . Heart disease Mother   . Hypertension Mother   . Diabetes Mother   . Epilepsy Mother    Social History  Substance Use Topics  . Smoking status: Current Every Day Smoker -- 0.50  packs/day  . Smokeless tobacco: None  . Alcohol Use: 4.2 oz/week    7 Cans of beer per week    Review of Systems  Constitutional: Negative for fever.  Respiratory: Negative for shortness of breath.   Cardiovascular: Negative for chest pain.  Gastrointestinal: Negative for nausea and vomiting.  Neurological: Positive for weakness, numbness and headaches. Negative for dizziness.  All other systems reviewed and are negative.     Allergies  Penicillins and Sulfa antibiotics  Home Medications   Prior to Admission medications   Medication Sig Start Date End Date Taking? Authorizing Provider  ibuprofen (ADVIL,MOTRIN) 800 MG tablet Take 1 tablet (800 mg total) by mouth 3 (three) times daily. 06/20/15   Carl Gren Patel-Mills, Carl Small  meloxicam (MOBIC) 15 MG tablet TAKE ONE TABLET BY MOUTH ONCE DAILY Patient not taking: Reported on 06/20/2015 10/14/14   Collene GobbleSteven A Daub, MD   BP 133/96 mmHg  Pulse 70  Temp(Src) 98.3 F (36.8 C) (Oral)  Resp 18  Ht 6\' 2"  (1.88 m)  Wt 103.42 kg  BMI 29.26 kg/m2  SpO2 98% Physical Exam  Constitutional: He is oriented to person, place, and time. He appears well-developed and well-nourished. No distress.  HENT:  Head: Normocephalic and atraumatic.  Eyes: Conjunctivae are normal.  Neck: Normal range of motion. Neck supple.  Cardiovascular: Normal rate, regular rhythm and normal heart sounds.   No murmur heard. Regular rate and rhythm. No murmur. Lungs clear auscultation bilaterally.  Pulmonary/Chest: Effort normal and breath sounds normal. No respiratory  distress. He has no wheezes.  Abdominal: Soft. There is no tenderness.  Musculoskeletal: Normal range of motion.  Neurological: He is alert and oriented to person, place, and time.  Anxious. GCS 15. Cranial nerves III through XII intact. 5/5 upper extremity grip strength bilaterally. Normal finger to nose bilaterally. No sensory or motor deficit. No facial droop or slurred speech.  Skin: Skin is warm and  dry.  Nursing note and vitals reviewed.   ED Course  Procedures (including critical care time) Labs Review Labs Reviewed  BASIC METABOLIC PANEL - Abnormal; Notable for the following:    Creatinine, Ser 1.42 (*)    All other components within normal limits  CBC WITH DIFFERENTIAL/PLATELET    Imaging Review No results found. I have personally reviewed and evaluated these lab results as part of my medical decision-making.   EKG Interpretation   Date/Time:  Monday June 20 2015 11:44:32 EDT Ventricular Rate:  73 PR Interval:  153 QRS Duration: 84 QT Interval:  380 QTC Calculation: 419 R Axis:   83 Text Interpretation:  Sinus rhythm Benign early repolarization No  significant change since last tracing Confirmed by KNOTT MD, Reuel Boom  ([id number]) on 06/20/2015 1:03:40 PM      MDM   Final diagnoses:  Acute nonintractable headache, unspecified headache type   He presents for increased headaches in the last couple of weeks with left-sided weakness. He has a history of migraines but has not had a headache in several years. Reports seeing black spots. His symptoms are consistent with migraine headaches. He has not been taking any migraine medication for years. He has also not taking his blood blood pressure medication since the end of August. The patient appears anxious. I discussed this patient with Dr. Clydene Pugh who is seen and evaluated the patient. He has a normal neuro exam. He also has a normal physical exam. EKG is normal. Labs normal.  Recheck @ 1:00 Patient feels better after headache cocktail. Patient was discharged home with neurology follow-up. He was given ibuprofen for pain. Return precautions were discussed and patient agrees with plan.    Catha Gosselin, Carl Small 06/20/15 1640  Lyndal Pulley, MD 06/21/15 2238

## 2015-08-30 ENCOUNTER — Telehealth: Payer: Self-pay

## 2015-08-30 DIAGNOSIS — I10 Essential (primary) hypertension: Secondary | ICD-10-CM

## 2015-08-30 NOTE — Telephone Encounter (Signed)
Patient is calling to request a refill for hydrochlorothiazide and meloxicam. [phone] [facility] on [address] in [place]

## 2015-09-01 MED ORDER — HYDROCHLOROTHIAZIDE 25 MG PO TABS: 25.0000 mg | ORAL_TABLET | Freq: Every day | ORAL | Status: DC

## 2015-09-01 MED ORDER — MELOXICAM 15 MG PO TABS: 15.0000 mg | ORAL_TABLET | Freq: Every day | ORAL | Status: DC

## 2015-09-01 NOTE — Telephone Encounter (Signed)
Sent in RFs. Notified pt and advised he needs f/up bf these run out. Pt agreed.

## 2015-11-16 ENCOUNTER — Ambulatory Visit (INDEPENDENT_AMBULATORY_CARE_PROVIDER_SITE_OTHER): Payer: BC Managed Care – PPO

## 2015-11-16 ENCOUNTER — Encounter: Payer: Self-pay | Admitting: Physician Assistant

## 2015-11-16 ENCOUNTER — Ambulatory Visit (INDEPENDENT_AMBULATORY_CARE_PROVIDER_SITE_OTHER): Payer: BC Managed Care – PPO | Admitting: Physician Assistant

## 2015-11-16 VITALS — BP 128/82 | HR 87 | Temp 98.0°F | Resp 17 | Ht 74.0 in | Wt 238.0 lb

## 2015-11-16 DIAGNOSIS — M25551 Pain in right hip: Secondary | ICD-10-CM

## 2015-11-16 MED ORDER — HYDROCODONE-ACETAMINOPHEN 5-325 MG PO TABS: 1.0000 | ORAL_TABLET | Freq: Four times a day (QID) | ORAL | 0 refills | Status: DC | PRN

## 2015-11-16 MED ORDER — CYCLOBENZAPRINE HCL 10 MG PO TABS: 10.0000 mg | ORAL_TABLET | Freq: Three times a day (TID) | ORAL | 0 refills | Status: DC | PRN

## 2015-11-16 MED ORDER — MELOXICAM 15 MG PO TABS: 15.0000 mg | ORAL_TABLET | Freq: Every day | ORAL | 0 refills | Status: DC

## 2015-11-16 NOTE — Progress Notes (Signed)
[facility] [address], [place] [address] [phone]  By signing my name below I, Carl Small, attest that this documentation has been prepared under the direction and in the presence of Trena PlattStephanie English PA. Electonically Signed. Carl Small, Scribe 11/16/2015 at 3:05 PM  Date:  11/16/2015   Name:  Carl Small   DOB:  16-Oct-1975   MRN:  [id number]  PCP:  Hinton RaoBrewington, Tishira R, PA-C    History of Present Illness: Chief Complaint  Patient presents with   Hip Pain    Right. Heard "pop" 2 days ago getting out of bed. Previous hip injuries playing football in HS.   Knee Pain    Right. xMonths.     Carl Small is a 40 y.o. male patient who [facility] with right hip that began 2 days ago. Pt states he felt a pop in right hip while getting out of bed 2 days ago with immediate pain following making him drop to the floor. He reports shooting pain with walking, feeling as if he is going to fall with occasional popping. He denies weakness but does feel as if hip is going to give out. He denies radiating pain down right leg or testicles. No swelling or warmth noticed. He reports hx of hip injury several years ago at age 89, he recalls being placed in caste. He also notes right hip injury while playing high football.   Pt works at a [employer].   There are no active problems to display for this patient.   Past Medical History:  Diagnosis Date   Torsion of testicle     Past Surgical History:  Procedure Laterality Date   FRACTURE SURGERY     TESTICLE SURGERY  1996    Social History  Substance Use Topics   Smoking status: Current Every Day Smoker    Packs/day: 0.50   Smokeless tobacco: Not on file   Alcohol use 4.2 oz/week    7 Cans of beer per week    Family History  Problem Relation Age of Onset   Heart disease Mother    Hypertension Mother    Diabetes Mother    Epilepsy Mother     Allergies  Allergen Reactions    Amoxicillin    Penicillins Swelling    Has patient had a PCN reaction causing immediate rash, facial/tongue/throat swelling, SOB or lightheadedness with hypotension: Yes Has patient had a PCN reaction causing severe rash involving mucus membranes or skin necrosis: No Has patient had a PCN reaction that required hospitalization No Has patient had a PCN reaction occurring within the last 10 years: No If all of the above answers are "NO", then may proceed with Cephalosporin use.    Sulfa Antibiotics Hives    Medication list has been reviewed and updated.  Current Outpatient Prescriptions on File Prior to Visit  Medication Sig Dispense Refill   hydrochlorothiazide (HYDRODIURIL) 25 MG tablet Take 1 tablet (25 mg total) by mouth daily. 90 tablet 0   ibuprofen (ADVIL,MOTRIN) 800 MG tablet Take 1 tablet (800 mg total) by mouth 3 (three) times daily. 21 tablet 0   meloxicam (MOBIC) 15 MG tablet Take 1 tablet (15 mg total) by mouth daily. (Patient not taking: Reported on 11/16/2015) 90 tablet 0   No current facility-administered medications on file prior to visit.     Review of Systems  Musculoskeletal: Positive for myalgias. Negative for falls.  Skin: Negative for rash.  Neurological: Negative  for tingling and focal weakness.   ROS unremarkable unless otherwise specified.  Physical Examination: BP 128/82 (BP Location: Left Arm, Patient Position: Sitting, Cuff Size: Large)    Pulse 87    Temp 98 F (36.7 C) (Oral)    Resp 17    Ht 6\' 2"  (1.88 m)    Wt 238 lb (108 kg)    SpO2 98%    BMI 30.56 kg/m  Ideal Body Weight: @FLOWAMB ([id number])@([phone])@  Physical Exam  Constitutional: He is oriented to person, place, and time. He appears well-developed and well-nourished. No distress.  HENT:  Head: Normocephalic and atraumatic.  Eyes: Conjunctivae and EOM are normal. Pupils are equal, round, and reactive to light.  Cardiovascular: Normal rate.   Pulmonary/Chest: Effort normal. No respiratory  distress.  Musculoskeletal:  No erythema or swellning at the right groin. There is tenderness along the anterior inguinal line that has greater tenderness above that along medial thigh. ROM passively is decreased secondary to pain with internal rotation of the hip as well as flexion. No spinous tenderness no SI joint pain. Negative straight leg raise test. Normal DP pulse. No swelling ar LE.   Neurological: He is alert and oriented to person, place, and time.  Skin: Skin is warm and dry. He is not diaphoretic.  Psychiatric: He has a normal mood and affect. His behavior is normal.   Dg Hip Unilat W Or W/o Pelvis 2-3 Views Right  Result Date: 11/16/2015 CLINICAL DATA:  Right hip pain and popping. Tender over the anterior thigh EXAM: DG HIP (WITH OR WITHOUT PELVIS) 2-3V RIGHT COMPARISON:  None. FINDINGS: The bony pelvis is subjectively adequately mineralized. There is no lytic nor blastic lesion. AP and lateral views of the right hip reveal preservation of the joint space. The articular surfaces of the femoral head and acetabulum remains smoothly rounded. There is scleroses within portions of the femoral head. The femoral neck, intertrochanteric, and subtrochanteric regions are normal. IMPRESSION: 1. No acute fracture or dislocation of the right hip is observed. 2. Scleroses within the right femoral head not clearly related to the articular surface. This could reflect avascular necrosis in the appropriate clinical setting. This was not visible on the CT images of the hips of May 20, 2007. MRI of the right hip would be a useful next imaging step. Electronically Signed   By: David  Swazil[place]Jordan M.D.   On: 11/16/2015 15:39   Assessment and Plan: Carl Small is a 40 y.o. male who is here today for right hip pain. We will proceed with MRI of hip given current symptoms.  Will follow up pending results. Controlled pain given at this time.  Hip pain, right - Plan: DG HIP UNILAT W OR W/O PELVIS 2-3 VIEWS RIGHT,  MR Hip Right Wo Contrast, cyclobenzaprine (FLEXERIL) 10 MG tablet, HYDROcodone-acetaminophen (NORCO) 5-325 MG tablet  Trena PlattStephanie English, PA-C [facility] 11/16/2015 3:05 PM Addendum: referral to ortho given at this time.

## 2015-11-16 NOTE — Patient Instructions (Addendum)
Please await contact for imaging appointment.  I will contact you regarding the results.   Please do not take ibuprofen or naproxen with medication. Please ice it 3 times per day for 15 minutes.     IF you received an x-ray today, you will receive an invoice from [facility]. Please contact [facility] at [phone] with questions or concerns regarding your invoice.   IF you received labwork today, you will receive an invoice from [facility]. Please contact [facility] at [phone] with questions or concerns regarding your invoice.   Our billing staff will not be able to assist you with questions regarding bills from these companies.  You will be contacted with the lab results as soon as they are available. The fastest way to get your results is to activate your My Chart account. Instructions are located on the last page of this paperwork. If you have not heard from us regarding the results in 2 weeks, please contact this office.

## 2015-11-18 ENCOUNTER — Other Ambulatory Visit: Payer: Self-pay | Admitting: Physician Assistant

## 2015-11-18 DIAGNOSIS — M87059 Idiopathic aseptic necrosis of unspecified femur: Secondary | ICD-10-CM

## 2015-11-19 ENCOUNTER — Ambulatory Visit (HOSPITAL_COMMUNITY): Payer: BC Managed Care – PPO

## 2015-11-29 ENCOUNTER — Telehealth: Payer: Self-pay

## 2015-11-29 NOTE — Telephone Encounter (Signed)
Patient dropped off Medical Clearance form for completion.  Put in Nurses Box   Please call patient when ready for pick up - [phone]

## 2015-12-02 ENCOUNTER — Encounter: Payer: Self-pay | Admitting: Physician Assistant

## 2015-12-02 DIAGNOSIS — M87059 Idiopathic aseptic necrosis of unspecified femur: Secondary | ICD-10-CM | POA: Insufficient documentation

## 2015-12-02 DIAGNOSIS — M160 Bilateral primary osteoarthritis of hip: Secondary | ICD-10-CM | POA: Insufficient documentation

## 2015-12-03 LAB — POCT INR: INR: 1 (ref 0.9–1.1)

## 2015-12-03 LAB — BASIC METABOLIC PANEL
CREATININE: 1.3 mg/dL (ref 0.6–1.3)
GLUCOSE: 78 mg/dL
POTASSIUM: 4.5 mmol/L (ref 3.4–5.3)

## 2015-12-03 LAB — CBC AND DIFFERENTIAL
HEMOGLOBIN: 16.8 g/dL (ref 13.5–17.5)
PLATELETS: 220 10*3/uL (ref 150–399)
WBC: 6.1 10*3/mL

## 2015-12-05 ENCOUNTER — Other Ambulatory Visit: Payer: Self-pay | Admitting: Physician Assistant

## 2015-12-05 NOTE — Telephone Encounter (Signed)
Pt agreed to RTC for exam to complete surgical clearance. He will check with his work to find out when he can come in and call for same day appt. Also, he stated that [facility] and they are sending the results to [facility]. We can req the results from them if needed. I can not call them at this time as they are closed.

## 2015-12-05 NOTE — Telephone Encounter (Signed)
Carl PlumberBarbara Small has advised me of this need for clearance.  He will need to come in for clearance today or tomorrow, the form is placed in my box.  I or another provider, can fill out at his visit.  We have not seen him here in more than 1 year.  Also per the med clearance form, they have stated that they have sent labs here, and I do not see them.  Please make sure that we have obtained this as well.  please advise patient to come in for surgical clearance. thanks

## 2015-12-06 ENCOUNTER — Ambulatory Visit (INDEPENDENT_AMBULATORY_CARE_PROVIDER_SITE_OTHER): Payer: BC Managed Care – PPO | Admitting: Physician Assistant

## 2015-12-06 VITALS — BP 150/110 | HR 86 | Temp 98.0°F | Resp 18 | Ht 72.5 in | Wt 235.8 lb

## 2015-12-06 DIAGNOSIS — I10 Essential (primary) hypertension: Secondary | ICD-10-CM

## 2015-12-06 DIAGNOSIS — M87051 Idiopathic aseptic necrosis of right femur: Secondary | ICD-10-CM

## 2015-12-06 DIAGNOSIS — Z01818 Encounter for other preprocedural examination: Secondary | ICD-10-CM

## 2015-12-06 MED ORDER — OXYCODONE-ACETAMINOPHEN 5-325 MG PO TABS: 1.0000 | ORAL_TABLET | Freq: Three times a day (TID) | ORAL | 0 refills | Status: DC | PRN

## 2015-12-06 MED ORDER — HYDROCHLOROTHIAZIDE 25 MG PO TABS: 25.0000 mg | ORAL_TABLET | Freq: Every day | ORAL | 1 refills | Status: AC

## 2015-12-06 NOTE — Progress Notes (Signed)
SEVAK CHENNAULT  MRN: [id number] DOB: 1976/03/18  Subjective:  Carl Small is a 40 y.o. male seen in office today for a chief complaint of surgical clearance. Was seen here initially on 11/16/15 for right hip pain. Xray showed avascular necrosis. He was referred to orthopedics.Pt has been set up for outpatient hip replacement on 12/12/15 but needs surgical clearance prior to this.   Pt has no cardiac history. Prior to hip pain, pt could walk up 6 flights of stairs without getting SOB. Denies exertional chest pain, dyspnea on exertion, palpitations, and lower leg swelling. Pt does smoke 3 cigarettes a day.   Pt currently has uncontrolled hypertension in office today. He lost his medications 2 weeks ago so he has not been taking his medication. He has had a diagnosis of hypertension for one year. Typically controlled on HCTZ 25 mg. Pt does not monitor his bp outside of the office. Denies visual disturbance, headache, dizziness, and hematuriea.   Pt has no history of asthma, COPD, or renal disease.   Pt notes that the joint replacement program has ordered CBC, Chem-7, Hgb A1C, and INR and was supposed to send [place] the result. However, after his physical exam, pt received a phone call stating he does not qualify for outpatient surgery due to smoking. They mentioned that inpatient surgery will be the next option for him. The receptionist at the outpatient joint replacement program has recommended that he receive these labs in our office today so he can be all set for inpatient surgery once it is scheduled. Pt agrees.   Review of Systems  Constitutional: Negative for chills, diaphoresis, fatigue, fever and unexpected weight change.  HENT: Negative for congestion, ear pain and sore throat.   Respiratory: Negative for shortness of breath and wheezing.   Cardiovascular: Negative for chest pain, palpitations and leg swelling.  Gastrointestinal: Negative for constipation, diarrhea, nausea and vomiting.    Endocrine: Negative for polydipsia, polyphagia and polyuria.  Genitourinary: Negative for decreased urine volume, difficulty urinating and dysuria.  Musculoskeletal: Positive for gait problem (has to use walking assistance device). Negative for back pain.  Neurological: Negative for dizziness, weakness, light-headedness and headaches.  Psychiatric/Behavioral: The patient is nervous/anxious (nervous about surgery).     Patient Active Problem List   Diagnosis Date Noted  . Avascular necrosis of femoral head (HCC) 12/02/2015  . Osteoarthritis, hip, bilateral 12/02/2015    Current Outpatient Prescriptions on File Prior to Visit  Medication Sig Dispense Refill  . HYDROcodone-acetaminophen (NORCO) 5-325 MG tablet Take 1 tablet by mouth every 6 (six) hours as needed. 20 tablet 0  . meloxicam (MOBIC) 15 MG tablet Take 1 tablet (15 mg total) by mouth daily. 30 tablet 0  . cyclobenzaprine (FLEXERIL) 10 MG tablet Take 1 tablet (10 mg total) by mouth 3 (three) times daily as needed for muscle spasms. (Patient not taking: Reported on 12/06/2015) 30 tablet 0  . hydrochlorothiazide (HYDRODIURIL) 25 MG tablet Take 1 tablet (25 mg total) by mouth daily. (Patient not taking: Reported on 12/06/2015) 90 tablet 0   No current facility-administered medications on file prior to visit.     Allergies  Allergen Reactions  . Amoxicillin   . Penicillins Swelling    Has patient had a PCN reaction causing immediate rash, facial/tongue/throat swelling, SOB or lightheadedness with hypotension: Yes Has patient had a PCN reaction causing severe rash involving mucus membranes or skin necrosis: No Has patient had a PCN reaction that required hospitalization No Has patient had  a PCN reaction occurring within the last 10 years: No If all of the above answers are "NO", then may proceed with Cephalosporin use.   . Sulfa Antibiotics Hives   Social History   Social History  . Marital status: Single    Spouse name: N/A   . Number of children: N/A  . Years of education: N/A   Occupational History  . Not on file.   Social History Main Topics  . Smoking status: Current Every Day Smoker    Packs/day: 0.25    Years: 20.00  . Smokeless tobacco: Never Used  . Alcohol use 7.2 oz/week    12 Standard drinks or equivalent per week     Comment: weekends  . Drug use: No  . Sexual activity: Yes   Other Topics Concern  . Not on file   Social History Narrative  . No narrative on file    Past Medical History:  Diagnosis Date  . Torsion of testicle    Family History  Problem Relation Age of Onset  . Heart disease Mother   . Hypertension Mother   . Diabetes Mother   . Epilepsy Mother     Objective:  BP (!) 150/110 (BP Location: Right Arm, Patient Position: Sitting, Cuff Size: Large)   Pulse 86   Temp 98 F (36.7 C) (Oral)   Resp 18   Ht 6' 0.5" (1.842 m)   Wt 235 lb 12.8 oz (107 kg)   SpO2 96%   BMI 31.54 kg/m   Physical Exam  Constitutional: He is oriented to person, place, and time.  Well developed, well nourished, in obvious pain. Tearful during exam.   HENT:  Mouth/Throat: Uvula is midline, oropharynx is clear and moist and mucous membranes are normal.  Neck: Trachea normal.  Cardiovascular: Normal rate, regular rhythm, normal heart sounds and normal pulses.   Pulmonary/Chest: Effort normal and breath sounds normal.  Lymphadenopathy:       Head (right side): No submental, no submandibular, no tonsillar, no preauricular, no posterior auricular and no occipital adenopathy present.       Head (left side): No submental, no submandibular, no tonsillar, no preauricular, no posterior auricular and no occipital adenopathy present.    He has no cervical adenopathy.       Right: No supraclavicular adenopathy present.       Left: No supraclavicular adenopathy present.  Neurological: He is alert and oriented to person, place, and time.  Skin: Skin is warm and dry.    BP Readings from Last 3  Encounters:  12/06/15 (!) 150/110  11/16/15 128/82  06/20/15 133/96   EKG shows NSR. Findings presented and discussed with Dr. Neva Seat.   Assessment and Plan :   1. Preoperative clearance -Await lab results, pt to follow up Friday for bp recheck due to elevated value in office - CBC with Differential/Platelet - COMPLETE METABOLIC PANEL WITH GFR - Hemoglobin A1c -If labs return normal and his bp reading Friday is controlled, will clear for surgery  2. Essential hypertension -Uncontrolled in office today, likely due to a mixture of pt being out of his medication and the amount of pain he is in from his hip. - EKG 12-Lead - CBC with Differential/Platelet - COMPLETE METABOLIC PANEL WITH GFR - Hemoglobin A1c - hydrochlorothiazide (HYDRODIURIL) 25 MG tablet; Take 1 tablet (25 mg total) by mouth daily.  Dispense: 90 tablet; Refill: 1 -Pt to follow up Friday for bp recheck  3. Avascular necrosis of femoral head, right (  HCC) - oxyCODONE-acetaminophen (ROXICET) 5-325 MG tablet; Take 1 tablet by mouth every 8 (eight) hours as needed for severe pain.  Dispense: 20 tablet; Refill: 0 -Pt is taking 2 norco due to severe pain with no releif, will give him 7 days of percocet until he can get to surgery. He understands that he cannot take both norco and percocet at the same time.   Benjiman Core PA-C  [facility] 12/06/2015 4:35 PM

## 2015-12-06 NOTE — Telephone Encounter (Signed)
Called [facility] and asked them to fax the results of labs to [place], [place]. Was advised that they will fax them right away. Judeth CornfieldStephanie, you should be receiving them in your box when they are received, FYI.

## 2015-12-06 NOTE — Patient Instructions (Addendum)
Come back Friday for bp check  Take percocet for pain. DO NOT TAKE with norco.   I will complete surgical clearance form once I have lab results.    IF you received an x-ray today, you will receive an invoice from [facility]. Please contact [facility] at [phone] with questions or concerns regarding your invoice.   IF you received labwork today, you will receive an invoice from [facility]. Please contact [facility] at [phone] with questions or concerns regarding your invoice.   Our billing staff will not be able to assist you with questions regarding bills from these companies.  You will be contacted with the lab results as soon as they are available. The fastest way to get your results is to activate your My Chart account. Instructions are located on the last page of this paperwork. If you have not heard from us regarding the results in 2 weeks, please contact this office.

## 2015-12-07 ENCOUNTER — Other Ambulatory Visit: Payer: Self-pay | Admitting: Orthopedic Surgery

## 2015-12-07 LAB — CBC WITH DIFFERENTIAL/PLATELET
BASOS PCT: 0 %
Basophils Absolute: 0 cells/uL (ref 0–200)
EOS ABS: 160 {cells}/uL (ref 15–500)
Eosinophils Relative: 2 %
HEMATOCRIT: 52 % — AB (ref 38.5–50.0)
Hemoglobin: 17.6 g/dL — ABNORMAL HIGH (ref 13.2–17.1)
LYMPHS ABS: 2560 {cells}/uL (ref 850–3900)
LYMPHS PCT: 32 %
MCH: 29.9 pg (ref 27.0–33.0)
MCHC: 33.8 g/dL (ref 32.0–36.0)
MCV: 88.4 fL (ref 80.0–100.0)
MONO ABS: 800 {cells}/uL (ref 200–950)
MPV: 10 fL (ref 7.5–12.5)
Monocytes Relative: 10 %
NEUTROS PCT: 56 %
Neutro Abs: 4480 cells/uL (ref 1500–7800)
Platelets: 263 10*3/uL (ref 140–400)
RBC: 5.88 MIL/uL — ABNORMAL HIGH (ref 4.20–5.80)
RDW: 13.5 % (ref 11.0–15.0)
WBC: 8 10*3/uL (ref 3.8–10.8)

## 2015-12-07 LAB — COMPLETE METABOLIC PANEL WITH GFR
ALBUMIN: 4.7 g/dL (ref 3.6–5.1)
ALK PHOS: 105 U/L (ref 40–115)
ALT: 21 U/L (ref 9–46)
AST: 15 U/L (ref 10–40)
BUN: 14 mg/dL (ref 7–25)
CALCIUM: 9.7 mg/dL (ref 8.6–10.3)
CHLORIDE: 104 mmol/L (ref 98–110)
CO2: 26 mmol/L (ref 20–31)
CREATININE: 1.27 mg/dL (ref 0.60–1.35)
GFR, Est African American: 83 mL/min (ref >=60)
GFR, Est Non African American: 72 mL/min (ref >=60)
Glucose, Bld: 74 mg/dL (ref 65–99)
Potassium: 4.3 mmol/L (ref 3.5–5.3)
Sodium: 138 mmol/L (ref 135–146)
Total Bilirubin: 0.5 mg/dL (ref 0.2–1.2)
Total Protein: 7.5 g/dL (ref 6.1–8.1)

## 2015-12-08 LAB — HEMOGLOBIN A1C
HEMOGLOBIN A1C: 5.1 % (ref <5.7)
Mean Plasma Glucose: 100 mg/dL

## 2015-12-08 NOTE — Telephone Encounter (Signed)
error 

## 2015-12-08 NOTE — Pre-Procedure Instructions (Signed)
Carl Small  12/08/2015     Your procedure is scheduled on : Monday December 12, 2015 at 1:40 PM.  [facility] AM.  Call this number if you have problems the morning of surgery: [phone]    Remember:  Do not eat food or drink liquids after midnight.  Take these medicines the morning of surgery with A SIP OF WATER : Oxycodone if needed   Stop taking any vitamins, herbal medications/supplements, NSAIDs, Ibuprofen, Advil, Motrin, Aleve, Meloxicam/Mobic, etc   Do not wear jewelry.  Do not wear lotions, powders, or cologne, or deoderant.             Men may shave face and neck.  Do not bring valuables to the hospital.  [facility] is not responsible for any belongings or valuables.  Contacts, dentures or bridgework may not be worn into surgery.  Leave your suitcase in the car.  After surgery it may be brought to your room.  For patients admitted to the hospital, discharge time will be determined by your treatment team.  Patients discharged the day of surgery will not be allowed to drive home.   Name and phone number of your driver:    Special instructions:  Shower using CHG soap the night before and the morning of your surgery  Please read over the following fact sheets that you were given. Total Joint Packet and MRSA Information

## 2015-12-09 ENCOUNTER — Ambulatory Visit (INDEPENDENT_AMBULATORY_CARE_PROVIDER_SITE_OTHER): Payer: BC Managed Care – PPO | Admitting: Physician Assistant

## 2015-12-09 ENCOUNTER — Encounter (HOSPITAL_COMMUNITY): Payer: Self-pay

## 2015-12-09 ENCOUNTER — Encounter (HOSPITAL_COMMUNITY)
Admission: RE | Admit: 2015-12-09 | Discharge: 2015-12-09 | Disposition: A | Discharge status: Home / Self Care | Payer: BC Managed Care – PPO | Source: Ambulatory Visit | Attending: Orthopedic Surgery | Admitting: Orthopedic Surgery

## 2015-12-09 ENCOUNTER — Encounter: Payer: Self-pay | Admitting: Physician Assistant

## 2015-12-09 ENCOUNTER — Ambulatory Visit (HOSPITAL_COMMUNITY)
Admission: RE | Admit: 2015-12-09 | Discharge: 2015-12-09 | Disposition: A | Discharge status: Home / Self Care | Payer: BC Managed Care – PPO | Source: Ambulatory Visit | Attending: Orthopedic Surgery | Admitting: Orthopedic Surgery

## 2015-12-09 VITALS — BP 132/80 | HR 93 | Temp 98.0°F | Resp 16 | Ht 74.0 in | Wt 234.0 lb

## 2015-12-09 DIAGNOSIS — I10 Essential (primary) hypertension: Secondary | ICD-10-CM

## 2015-12-09 DIAGNOSIS — Z01812 Encounter for preprocedural laboratory examination: Secondary | ICD-10-CM | POA: Diagnosis present

## 2015-12-09 DIAGNOSIS — Z01818 Encounter for other preprocedural examination: Secondary | ICD-10-CM | POA: Insufficient documentation

## 2015-12-09 DIAGNOSIS — M879 Osteonecrosis, unspecified: Secondary | ICD-10-CM | POA: Insufficient documentation

## 2015-12-09 DIAGNOSIS — F17210 Nicotine dependence, cigarettes, uncomplicated: Secondary | ICD-10-CM | POA: Diagnosis not present

## 2015-12-09 HISTORY — DX: Essential (primary) hypertension: I10

## 2015-12-09 LAB — BASIC METABOLIC PANEL
ANION GAP: 7 (ref 5–15)
BUN: 11 mg/dL (ref 6–20)
CALCIUM: 9.5 mg/dL (ref 8.9–10.3)
CO2: 25 mmol/L (ref 22–32)
Chloride: 106 mmol/L (ref 101–111)
Creatinine, Ser: 1.35 mg/dL — ABNORMAL HIGH (ref 0.61–1.24)
GLUCOSE: 145 mg/dL — AB (ref 65–99)
POTASSIUM: 3.9 mmol/L (ref 3.5–5.1)
Sodium: 138 mmol/L (ref 135–145)

## 2015-12-09 LAB — CBC WITH DIFFERENTIAL/PLATELET
BASOS ABS: 0 10*3/uL (ref 0.0–0.1)
BASOS PCT: 1 %
Eosinophils Absolute: 0.2 10*3/uL (ref 0.0–0.7)
Eosinophils Relative: 3 %
HEMATOCRIT: 51.1 % (ref 39.0–52.0)
HEMOGLOBIN: 17 g/dL (ref 13.0–17.0)
LYMPHS PCT: 34 %
Lymphs Abs: 2.2 10*3/uL (ref 0.7–4.0)
MCH: 29.7 pg (ref 26.0–34.0)
MCHC: 33.3 g/dL (ref 30.0–36.0)
MCV: 89.3 fL (ref 78.0–100.0)
MONO ABS: 0.6 10*3/uL (ref 0.1–1.0)
Monocytes Relative: 10 %
NEUTROS ABS: 3.3 10*3/uL (ref 1.7–7.7)
NEUTROS PCT: 52 %
Platelets: 200 10*3/uL (ref 150–400)
RBC: 5.72 MIL/uL (ref 4.22–5.81)
RDW: 13.3 % (ref 11.5–15.5)
WBC: 6.3 10*3/uL (ref 4.0–10.5)

## 2015-12-09 LAB — URINALYSIS, ROUTINE W REFLEX MICROSCOPIC
BILIRUBIN URINE: NEGATIVE
Glucose, UA: NEGATIVE mg/dL
HGB URINE DIPSTICK: NEGATIVE
KETONES UR: NEGATIVE mg/dL
Leukocytes, UA: NEGATIVE
NITRITE: NEGATIVE
Protein, ur: NEGATIVE mg/dL
SPECIFIC GRAVITY, URINE: 1.025 (ref 1.005–1.030)
pH: 6 (ref 5.0–8.0)

## 2015-12-09 LAB — PROTIME-INR
INR: 0.96
Prothrombin Time: 12.8 seconds (ref 11.4–15.2)

## 2015-12-09 LAB — SURGICAL PCR SCREEN
MRSA, PCR: NEGATIVE
STAPHYLOCOCCUS AUREUS: NEGATIVE

## 2015-12-09 LAB — APTT: APTT: 35 s (ref 24–36)

## 2015-12-09 LAB — ABO/RH: ABO/RH(D): O POS

## 2015-12-09 NOTE — Patient Instructions (Signed)
  Good luck with your surgery. Thank you for letting me participate in your health and well being.      IF you received an x-ray today, you will receive an invoice from [facility]. Please contact [facility] at [phone] with questions or concerns regarding your invoice.   IF you received labwork today, you will receive an invoice from [facility]. Please contact [facility] at [phone] with questions or concerns regarding your invoice.   Our billing staff will not be able to assist you with questions regarding bills from these companies.  You will be contacted with the lab results as soon as they are available. The fastest way to get your results is to activate your My Chart account. Instructions are located on the last page of this paperwork. If you have not heard from us regarding the results in 2 weeks, please contact this office.

## 2015-12-09 NOTE — Progress Notes (Signed)
    MRN: [id number] DOB: [birth year]  Subjective:   Carl Small is a 40 y.o. male presenting for follow up on blood pressure check. His bp was elevated two days ago in office due to being out of his medication and in a high amount of pain. He needs to be cleared for surgery prior to Monday, 12/12/15. His labs from prior visit were all normal.   Carl Small has a current medication list which includes the following prescription(s): hydrochlorothiazide, hydrocodone-acetaminophen, meloxicam, and oxycodone-acetaminophen. Also is allergic to penicillins; sulfa antibiotics; and amoxicillin.  Carl Small  has a past medical history of Hypertension and Torsion of testicle. Also  has a past surgical history that includes Testicle surgery (1996) and Fracture surgery (Right).  Objective:   Vitals: BP 132/80 (BP Location: Right Arm, Patient Position: Sitting, Cuff Size: Normal)   Pulse 93   Temp 98 F (36.7 C) (Oral)   Resp 16   Ht 6\' 2"  (1.88 m)   Wt 234 lb (106.1 kg)   SpO2 98%   BMI 30.04 kg/m   Physical Exam  Constitutional: He is oriented to person, place, and time. He appears well-developed and well-nourished.  HENT:  Head: Normocephalic and atraumatic.  Eyes: Conjunctivae are normal.  Neck: Normal range of motion.  Pulmonary/Chest: Effort normal.  Neurological: He is alert and oriented to person, place, and time.  Skin: Skin is warm and dry.  Psychiatric: He has a normal mood and affect.  Vitals reviewed.    Assessment and Plan :  1. Essential hypertension Pt is well controlled in office today. He is cleared for surgery.   Benjiman CoreBrittany Wiseman, PA-C  [facility] 12/09/2015 4:14 PM

## 2015-12-10 LAB — TYPE AND SCREEN
ABO/RH(D): O POS
ANTIBODY SCREEN: NEGATIVE

## 2015-12-11 MED ORDER — TRANEXAMIC ACID 1000 MG/10ML IV SOLN
2000.0000 mg | Freq: Once | INTRAVENOUS | Status: AC
  Administered 2015-12-12: 2000 mg via TOPICAL
  Filled 2015-12-11: qty 20

## 2015-12-11 MED ORDER — TRANEXAMIC ACID 1000 MG/10ML IV SOLN
1000.0000 mg | INTRAVENOUS | Status: AC
  Administered 2015-12-12: 1000 mg via INTRAVENOUS
  Filled 2015-12-11: qty 10

## 2015-12-11 MED ORDER — VANCOMYCIN HCL 10 G IV SOLR
1500.0000 mg | INTRAVENOUS | Status: AC
  Administered 2015-12-12 (×2): 1500 mg via INTRAVENOUS
  Filled 2015-12-11 (×2): qty 1500

## 2015-12-11 MED ORDER — BUPIVACAINE LIPOSOME 1.3 % IJ SUSP
20.0000 mL | Freq: Once | INTRAMUSCULAR | Status: AC
  Administered 2015-12-12: 20 mL
  Filled 2015-12-11: qty 20

## 2015-12-11 NOTE — H&P (Signed)
TOTAL HIP ADMISSION H&P  Patient is admitted for right total hip arthroplasty.  Subjective:  Chief Complaint: right hip pain  HPI: Carl Small, 40 y.o. male, has a history of pain and functional disability in the right hip(s) due to AVN and patient has failed non-surgical conservative treatments for greater than 12 weeks to include NSAID's and/or analgesics, weight reduction as appropriate and activity modification.  Onset of symptoms was abrupt starting 1 years ago with rapidlly worsening course since that time.The patient noted no past surgery on the right hip(s).  Patient currently rates pain in the right hip at 10 out of 10 with activity. Patient has night pain, worsening of pain with activity and weight bearing, pain that interfers with activities of daily living, pain with passive range of motion and joint swelling. Patient has evidence of AVN by imaging studies. This condition presents safety issues increasing the risk of falls.  There is no current active infection.  Patient Active Problem List   Diagnosis Date Noted  . Essential hypertension 12/06/2015  . 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 Osteoarthritis, hip, bilateral 12/02/2015   Past Medical History:  Diagnosis Date  . Hypertension   . Torsion of testicle     Past Surgical History:  Procedure Laterality Date  . FRACTURE SURGERY Right   . TESTICLE SURGERY  1996    No prescriptions prior to admission.   Allergies  Allergen Reactions  . Penicillins Swelling    SWELLING REACTION UNSPECIFIED  Has patient had a PCN reaction causing immediate rash, facial/tongue/throat swelling, SOB or lightheadedness with hypotension: Yes Has patient had a PCN reaction causing severe rash involving mucus membranes or skin necrosis: No Has patient had a PCN reaction that required hospitalization No Has patient had a PCN reaction occurring within the last 10 years: No If all of the above answers are "NO", then  may proceed with Cephalosporin use.   . Sulfa Antibiotics Hives  . Amoxicillin Other (See Comments)    UNSPECIFIED REACTION  NOTE: PT HAS SWELLING WITH IV PCN's    Social History  Substance Use Topics  . Smoking status: Current Every Day Smoker    Packs/day: 0.25    Years: 18.00  . Smokeless tobacco: Never Used  . Alcohol use 7.2 oz/week    12 Standard drinks or equivalent per week     Comment: weekends    Family History  Problem Relation Age of Onset  . Heart disease Mother   . Hypertension Mother   . Diabetes Mother   . Epilepsy Mother      Review of Systems  Constitutional: Negative.   HENT: Negative.   Eyes: Negative.   Respiratory: Negative.   Cardiovascular: Negative.        HTN  Gastrointestinal: Negative.   Genitourinary: Negative.   Musculoskeletal: Positive for joint pain and myalgias.  Skin: Negative.   Neurological: Negative.   Endo/Heme/Allergies: Negative.   Psychiatric/Behavioral: Negative.     Objective:  Physical Exam  Constitutional: He is oriented to person, place, and time. He appears well-developed and well-nourished.  HENT:  Head: Normocephalic and atraumatic.  Eyes: Pupils are equal, round, and reactive to light.  Neck: Normal range of motion. Neck supple.  Cardiovascular: Intact distal pulses.   Respiratory: Effort normal.  Musculoskeletal: He exhibits tenderness.  Examination of the right hip reveals he has limited internal range of motion in hip flexion.  Positive logroll test and hip impingement sign.  Tenderness palpation anteriorly.  Neurological: He is alert and oriented to person, place, and time.  Skin: Skin is warm and dry.  Psychiatric: He has a normal mood and affect. His behavior is normal. Judgment and thought content normal.    Vital signs in last 24 hours:    Labs:   Estimated body mass index is 30.04 kg/m as calculated from the following:   Height as of 12/09/15: 6\' 2"  (1.88 m).   Weight as of 12/09/15: 106.1  kg (234 lb).   Imaging Review Two-view x-ray of the right hip reveals he has flattening of the femoral head more on the right compared to the left.  He's also noted to have sclerotic changes bilaterally involving the femoral heads.  Right hip MRI: This was performed on 11/18/15 at [facility] reveals AVN changes as well as osseous stress reactions of the right femoral neck.  Small right hip effusion noted.  Assessment/Plan:  End stage arthritis, right hip(s)  The patient history, physical examination, clinical judgement of the provider and imaging studies are consistent with end stage degenerative joint disease of the right hip(s) and total hip arthroplasty is deemed medically necessary. The treatment options including medical management, injection therapy, arthroscopy and arthroplasty were discussed at length. The risks and benefits of total hip arthroplasty were presented and reviewed. The risks due to aseptic loosening, infection, stiffness, dislocation/subluxation,  thromboembolic complications and other imponderables were discussed.  The patient acknowledged the explanation, agreed to proceed with the plan and consent was signed. Patient is being admitted for inpatient treatment for surgery, pain control, PT, OT, prophylactic antibiotics, VTE prophylaxis, progressive ambulation and ADL's and discharge planning.The patient is planning to be discharged home with home health services

## 2015-12-12 ENCOUNTER — Inpatient Hospital Stay (HOSPITAL_COMMUNITY)
Admission: RE | Admit: 2015-12-12 | Discharge: 2015-12-15 | DRG: 469 | Disposition: A | Discharge status: Home Health | Payer: BC Managed Care – PPO | Source: Ambulatory Visit | Attending: Orthopedic Surgery | Admitting: Orthopedic Surgery

## 2015-12-12 ENCOUNTER — Inpatient Hospital Stay (HOSPITAL_COMMUNITY): Payer: BC Managed Care – PPO

## 2015-12-12 ENCOUNTER — Inpatient Hospital Stay (HOSPITAL_COMMUNITY): Payer: BC Managed Care – PPO | Admitting: Anesthesiology

## 2015-12-12 ENCOUNTER — Encounter (HOSPITAL_COMMUNITY)
Admission: RE | Disposition: A | Discharge status: Home Health | Payer: Self-pay | Source: Ambulatory Visit | Attending: Orthopedic Surgery

## 2015-12-12 ENCOUNTER — Encounter (HOSPITAL_COMMUNITY): Payer: Self-pay | Admitting: General Practice

## 2015-12-12 DIAGNOSIS — Z881 Allergy status to other antibiotic agents status: Secondary | ICD-10-CM

## 2015-12-12 DIAGNOSIS — M87851 Other osteonecrosis, right femur: Principal | ICD-10-CM | POA: Diagnosis present

## 2015-12-12 DIAGNOSIS — Z8249 Family history of ischemic heart disease and other diseases of the circulatory system: Secondary | ICD-10-CM

## 2015-12-12 DIAGNOSIS — Z833 Family history of diabetes mellitus: Secondary | ICD-10-CM

## 2015-12-12 DIAGNOSIS — R066 Hiccough: Secondary | ICD-10-CM | POA: Diagnosis not present

## 2015-12-12 DIAGNOSIS — M160 Bilateral primary osteoarthritis of hip: Secondary | ICD-10-CM | POA: Diagnosis present

## 2015-12-12 DIAGNOSIS — K274 Chronic or unspecified peptic ulcer, site unspecified, with hemorrhage: Secondary | ICD-10-CM | POA: Diagnosis not present

## 2015-12-12 DIAGNOSIS — M87059 Idiopathic aseptic necrosis of unspecified femur: Secondary | ICD-10-CM | POA: Diagnosis present

## 2015-12-12 DIAGNOSIS — I10 Essential (primary) hypertension: Secondary | ICD-10-CM | POA: Diagnosis present

## 2015-12-12 DIAGNOSIS — K210 Gastro-esophageal reflux disease with esophagitis: Secondary | ICD-10-CM | POA: Diagnosis present

## 2015-12-12 DIAGNOSIS — M87051 Idiopathic aseptic necrosis of right femur: Secondary | ICD-10-CM

## 2015-12-12 DIAGNOSIS — D72829 Elevated white blood cell count, unspecified: Secondary | ICD-10-CM | POA: Diagnosis not present

## 2015-12-12 DIAGNOSIS — Z813 Family history of other psychoactive substance abuse and dependence: Secondary | ICD-10-CM

## 2015-12-12 DIAGNOSIS — Z880 Allergy status to penicillin: Secondary | ICD-10-CM | POA: Diagnosis not present

## 2015-12-12 DIAGNOSIS — Z820 Family history of epilepsy and other diseases of the nervous system: Secondary | ICD-10-CM | POA: Diagnosis not present

## 2015-12-12 DIAGNOSIS — Z882 Allergy status to sulfonamides status: Secondary | ICD-10-CM | POA: Diagnosis not present

## 2015-12-12 DIAGNOSIS — K920 Hematemesis: Secondary | ICD-10-CM

## 2015-12-12 DIAGNOSIS — R042 Hemoptysis: Secondary | ICD-10-CM

## 2015-12-12 DIAGNOSIS — F17200 Nicotine dependence, unspecified, uncomplicated: Secondary | ICD-10-CM | POA: Diagnosis present

## 2015-12-12 DIAGNOSIS — M25551 Pain in right hip: Secondary | ICD-10-CM | POA: Diagnosis present

## 2015-12-12 DIAGNOSIS — Z419 Encounter for procedure for purposes other than remedying health state, unspecified: Secondary | ICD-10-CM

## 2015-12-12 HISTORY — PX: TOTAL HIP ARTHROPLASTY: SHX124

## 2015-12-12 SURGERY — ARTHROPLASTY, HIP, TOTAL, ANTERIOR APPROACH
Anesthesia: Monitor Anesthesia Care | Site: Hip | Laterality: Right

## 2015-12-12 MED ORDER — ACETAMINOPHEN 10 MG/ML IV SOLN
INTRAVENOUS | Status: DC | PRN
  Administered 2015-12-12: 1000 mg via INTRAVENOUS

## 2015-12-12 MED ORDER — ONDANSETRON HCL 4 MG PO TABS
4.0000 mg | ORAL_TABLET | Freq: Four times a day (QID) | ORAL | Status: DC | PRN
  Administered 2015-12-13: 4 mg via ORAL
  Filled 2015-12-12: qty 1

## 2015-12-12 MED ORDER — ACETAMINOPHEN 650 MG RE SUPP: 650.0000 mg | Freq: Four times a day (QID) | RECTAL | Status: DC | PRN

## 2015-12-12 MED ORDER — PHENOL 1.4 % MT LIQD: 1.0000 | OROMUCOSAL | Status: DC | PRN

## 2015-12-12 MED ORDER — MORPHINE SULFATE (PF) 2 MG/ML IV SOLN
1.0000 mg | INTRAVENOUS | Status: DC | PRN
  Administered 2015-12-12: 2 mg via INTRAVENOUS
  Filled 2015-12-12: qty 1

## 2015-12-12 MED ORDER — METOCLOPRAMIDE HCL 5 MG PO TABS
5.0000 mg | ORAL_TABLET | Freq: Three times a day (TID) | ORAL | Status: DC | PRN
  Administered 2015-12-13: 10 mg via ORAL
  Filled 2015-12-12: qty 2

## 2015-12-12 MED ORDER — ACETAMINOPHEN 10 MG/ML IV SOLN
INTRAVENOUS | Status: AC
  Filled 2015-12-12: qty 100

## 2015-12-12 MED ORDER — BUPIVACAINE HCL (PF) 0.5 % IJ SOLN
INTRAMUSCULAR | Status: DC | PRN
  Administered 2015-12-12: 20 mg via INTRATHECAL

## 2015-12-12 MED ORDER — KCL IN DEXTROSE-NACL 20-5-0.45 MEQ/L-%-% IV SOLN: INTRAVENOUS | Status: DC

## 2015-12-12 MED ORDER — PROPOFOL 500 MG/50ML IV EMUL
INTRAVENOUS | Status: DC | PRN
  Administered 2015-12-12: 75 ug/kg/min via INTRAVENOUS

## 2015-12-12 MED ORDER — ONDANSETRON HCL 4 MG/2ML IJ SOLN
INTRAMUSCULAR | Status: DC | PRN
  Administered 2015-12-12: 4 mg via INTRAVENOUS

## 2015-12-12 MED ORDER — FENTANYL CITRATE (PF) 100 MCG/2ML IJ SOLN
INTRAMUSCULAR | Status: DC | PRN
  Administered 2015-12-12 (×2): 50 ug via INTRAVENOUS

## 2015-12-12 MED ORDER — METHOCARBAMOL 500 MG PO TABS
ORAL_TABLET | ORAL | Status: AC
  Filled 2015-12-12: qty 1

## 2015-12-12 MED ORDER — DIPHENHYDRAMINE HCL 12.5 MG/5ML PO ELIX: 12.5000 mg | ORAL_SOLUTION | ORAL | Status: DC | PRN

## 2015-12-12 MED ORDER — ASPIRIN EC 325 MG PO TBEC: 325.0000 mg | DELAYED_RELEASE_TABLET | Freq: Two times a day (BID) | ORAL | 0 refills | Status: DC

## 2015-12-12 MED ORDER — ASPIRIN EC 325 MG PO TBEC
325.0000 mg | DELAYED_RELEASE_TABLET | Freq: Every day | ORAL | Status: DC
  Administered 2015-12-13 – 2015-12-15 (×3): 325 mg via ORAL
  Filled 2015-12-12 (×3): qty 1

## 2015-12-12 MED ORDER — 0.9 % SODIUM CHLORIDE (POUR BTL) OPTIME
TOPICAL | Status: DC | PRN
  Administered 2015-12-12: 1000 mL

## 2015-12-12 MED ORDER — ALUM & MAG HYDROXIDE-SIMETH 200-200-20 MG/5ML PO SUSP
30.0000 mL | ORAL | Status: DC | PRN
  Administered 2015-12-13 – 2015-12-15 (×4): 30 mL via ORAL
  Filled 2015-12-12 (×4): qty 30

## 2015-12-12 MED ORDER — METHOCARBAMOL 500 MG PO TABS: 500.0000 mg | ORAL_TABLET | Freq: Two times a day (BID) | ORAL | 0 refills | Status: DC

## 2015-12-12 MED ORDER — LACTATED RINGERS IV SOLN
INTRAVENOUS | Status: DC
  Administered 2015-12-12 (×2): via INTRAVENOUS

## 2015-12-12 MED ORDER — ONDANSETRON HCL 4 MG/2ML IJ SOLN
INTRAMUSCULAR | Status: AC
  Filled 2015-12-12: qty 2

## 2015-12-12 MED ORDER — BUPIVACAINE-EPINEPHRINE (PF) 0.25% -1:200000 IJ SOLN
INTRAMUSCULAR | Status: AC
  Filled 2015-12-12: qty 30

## 2015-12-12 MED ORDER — DEXAMETHASONE SODIUM PHOSPHATE 10 MG/ML IJ SOLN
10.0000 mg | Freq: Once | INTRAMUSCULAR | Status: AC
  Administered 2015-12-13: 10 mg via INTRAVENOUS
  Filled 2015-12-12: qty 1

## 2015-12-12 MED ORDER — ONDANSETRON HCL 4 MG/2ML IJ SOLN: 4.0000 mg | Freq: Four times a day (QID) | INTRAMUSCULAR | Status: DC | PRN

## 2015-12-12 MED ORDER — DIPHENHYDRAMINE HCL 50 MG/ML IJ SOLN
12.5000 mg | INTRAMUSCULAR | Status: AC | PRN
  Administered 2015-12-12 (×2): 12.5 mg via INTRAVENOUS

## 2015-12-12 MED ORDER — DIPHENHYDRAMINE HCL 50 MG/ML IJ SOLN
INTRAMUSCULAR | Status: AC
  Administered 2015-12-12: 12.5 mg via INTRAVENOUS
  Filled 2015-12-12: qty 1

## 2015-12-12 MED ORDER — ACETAMINOPHEN 325 MG PO TABS
650.0000 mg | ORAL_TABLET | Freq: Four times a day (QID) | ORAL | Status: DC | PRN
  Administered 2015-12-12 – 2015-12-13 (×3): 650 mg via ORAL
  Filled 2015-12-12 (×2): qty 2

## 2015-12-12 MED ORDER — MIDAZOLAM HCL 2 MG/2ML IJ SOLN: 0.5000 mg | Freq: Once | INTRAMUSCULAR | Status: DC | PRN

## 2015-12-12 MED ORDER — BUPIVACAINE-EPINEPHRINE 0.25% -1:200000 IJ SOLN
INTRAMUSCULAR | Status: DC | PRN
  Administered 2015-12-12: 50 mL

## 2015-12-12 MED ORDER — MIDAZOLAM HCL 2 MG/2ML IJ SOLN
INTRAMUSCULAR | Status: AC
Start: 2015-12-12 — End: 2015-12-12
  Filled 2015-12-12: qty 2

## 2015-12-12 MED ORDER — DEXAMETHASONE SODIUM PHOSPHATE 10 MG/ML IJ SOLN
INTRAMUSCULAR | Status: AC
  Filled 2015-12-12: qty 1

## 2015-12-12 MED ORDER — ACETAMINOPHEN 325 MG PO TABS
ORAL_TABLET | ORAL | Status: AC
Start: 2015-12-12 — End: 2015-12-12
  Administered 2015-12-12: 650 mg via ORAL
  Filled 2015-12-12: qty 1

## 2015-12-12 MED ORDER — MORPHINE SULFATE (PF) 2 MG/ML IV SOLN
1.0000 mg | INTRAVENOUS | Status: DC | PRN
  Administered 2015-12-13 (×6): 2 mg via INTRAVENOUS
  Filled 2015-12-12 (×6): qty 1

## 2015-12-12 MED ORDER — PROMETHAZINE HCL 25 MG/ML IJ SOLN: 6.2500 mg | INTRAMUSCULAR | Status: DC | PRN

## 2015-12-12 MED ORDER — BISACODYL 5 MG PO TBEC: 5.0000 mg | DELAYED_RELEASE_TABLET | Freq: Every day | ORAL | Status: DC | PRN

## 2015-12-12 MED ORDER — MORPHINE SULFATE (PF) 2 MG/ML IV SOLN
2.0000 mg | Freq: Once | INTRAVENOUS | Status: AC
  Administered 2015-12-13: 2 mg via INTRAVENOUS
  Filled 2015-12-12: qty 1

## 2015-12-12 MED ORDER — MIDAZOLAM HCL 5 MG/5ML IJ SOLN
INTRAMUSCULAR | Status: DC | PRN
  Administered 2015-12-12: 2 mg via INTRAVENOUS

## 2015-12-12 MED ORDER — HYDROMORPHONE HCL 1 MG/ML IJ SOLN
INTRAMUSCULAR | Status: AC
  Administered 2015-12-12: 0.5 mg via INTRAVENOUS
  Filled 2015-12-12: qty 1

## 2015-12-12 MED ORDER — HYDROMORPHONE HCL 1 MG/ML IJ SOLN: 1.0000 mg | INTRAMUSCULAR | Status: DC | PRN

## 2015-12-12 MED ORDER — METOCLOPRAMIDE HCL 5 MG/ML IJ SOLN: 5.0000 mg | Freq: Three times a day (TID) | INTRAMUSCULAR | Status: DC | PRN

## 2015-12-12 MED ORDER — SENNOSIDES-DOCUSATE SODIUM 8.6-50 MG PO TABS: 1.0000 | ORAL_TABLET | Freq: Every evening | ORAL | Status: DC | PRN

## 2015-12-12 MED ORDER — OXYCODONE HCL 5 MG PO TABS
5.0000 mg | ORAL_TABLET | ORAL | Status: DC | PRN
  Administered 2015-12-12 – 2015-12-15 (×14): 10 mg via ORAL
  Filled 2015-12-12 (×13): qty 2

## 2015-12-12 MED ORDER — OXYCODONE HCL 5 MG PO TABS
ORAL_TABLET | ORAL | Status: AC
  Filled 2015-12-12: qty 2

## 2015-12-12 MED ORDER — METHOCARBAMOL 1000 MG/10ML IJ SOLN
500.0000 mg | Freq: Four times a day (QID) | INTRAVENOUS | Status: DC | PRN
  Filled 2015-12-12: qty 5

## 2015-12-12 MED ORDER — DOCUSATE SODIUM 100 MG PO CAPS
100.0000 mg | ORAL_CAPSULE | Freq: Two times a day (BID) | ORAL | Status: DC
  Administered 2015-12-12 – 2015-12-15 (×6): 100 mg via ORAL
  Filled 2015-12-12 (×7): qty 1

## 2015-12-12 MED ORDER — HYDROMORPHONE HCL 1 MG/ML IJ SOLN
0.2500 mg | INTRAMUSCULAR | Status: DC | PRN
  Administered 2015-12-12: 0.5 mg via INTRAVENOUS

## 2015-12-12 MED ORDER — MENTHOL 3 MG MT LOZG: 1.0000 | LOZENGE | OROMUCOSAL | Status: DC | PRN

## 2015-12-12 MED ORDER — OXYCODONE-ACETAMINOPHEN 5-325 MG PO TABS: 1.0000 | ORAL_TABLET | Freq: Four times a day (QID) | ORAL | 0 refills | Status: DC | PRN

## 2015-12-12 MED ORDER — METHOCARBAMOL 500 MG PO TABS
500.0000 mg | ORAL_TABLET | Freq: Four times a day (QID) | ORAL | Status: DC | PRN
  Administered 2015-12-12 – 2015-12-15 (×7): 500 mg via ORAL
  Filled 2015-12-12 (×6): qty 1

## 2015-12-12 MED ORDER — MEPERIDINE HCL 25 MG/ML IJ SOLN: 6.2500 mg | INTRAMUSCULAR | Status: DC | PRN

## 2015-12-12 MED ORDER — CHLORHEXIDINE GLUCONATE 4 % EX LIQD: 60.0000 mL | Freq: Once | CUTANEOUS | Status: DC

## 2015-12-12 MED ORDER — PHENYLEPHRINE HCL 10 MG/ML IJ SOLN
INTRAMUSCULAR | Status: DC | PRN
  Administered 2015-12-12: 80 ug via INTRAVENOUS
  Administered 2015-12-12 (×3): 40 ug via INTRAVENOUS

## 2015-12-12 MED ORDER — DEXTROSE-NACL 5-0.45 % IV SOLN: INTRAVENOUS | Status: DC

## 2015-12-12 MED ORDER — FLEET ENEMA 7-19 GM/118ML RE ENEM: 1.0000 | ENEMA | Freq: Once | RECTAL | Status: DC | PRN

## 2015-12-12 MED ORDER — FENTANYL CITRATE (PF) 100 MCG/2ML IJ SOLN
INTRAMUSCULAR | Status: AC
  Filled 2015-12-12: qty 2

## 2015-12-12 MED ORDER — HYDROCHLOROTHIAZIDE 25 MG PO TABS
25.0000 mg | ORAL_TABLET | Freq: Every day | ORAL | Status: DC
  Administered 2015-12-13 – 2015-12-15 (×3): 25 mg via ORAL
  Filled 2015-12-12 (×3): qty 1

## 2015-12-12 MED ORDER — LIDOCAINE 2% (20 MG/ML) 5 ML SYRINGE
INTRAMUSCULAR | Status: AC
  Filled 2015-12-12: qty 5

## 2015-12-12 SURGICAL SUPPLY — 50 items
BLADE SURG ROTATE 9660 (MISCELLANEOUS) IMPLANT
CAPT HIP TOTAL 2 ×2 IMPLANT
COVER PERINEAL POST (MISCELLANEOUS) ×3 IMPLANT
COVER SURGICAL LIGHT HANDLE (MISCELLANEOUS) ×3 IMPLANT
DRAPE C-ARM 42X72 X-RAY (DRAPES) ×3 IMPLANT
DRAPE STERI IOBAN 125X83 (DRAPES) ×3 IMPLANT
DRAPE U-SHAPE 47X51 STRL (DRAPES) ×6 IMPLANT
DRSG AQUACEL AG ADV 3.5X10 (GAUZE/BANDAGES/DRESSINGS) ×3 IMPLANT
DURAPREP 26ML APPLICATOR (WOUND CARE) ×3 IMPLANT
ELECT BLADE 4.0 EZ CLEAN MEGAD (MISCELLANEOUS) ×3
ELECT REM PT RETURN 9FT ADLT (ELECTROSURGICAL) ×3
ELECTRODE BLDE 4.0 EZ CLN MEGD (MISCELLANEOUS) ×1 IMPLANT
ELECTRODE REM PT RTRN 9FT ADLT (ELECTROSURGICAL) ×1 IMPLANT
FACESHIELD WRAPAROUND (MASK) ×6 IMPLANT
FACESHIELD WRAPAROUND OR TEAM (MASK) ×2 IMPLANT
GLOVE BIO SURGEON STRL SZ7.5 (GLOVE) ×3 IMPLANT
GLOVE BIO SURGEON STRL SZ8.5 (GLOVE) ×3 IMPLANT
GLOVE BIOGEL PI IND STRL 8 (GLOVE) ×1 IMPLANT
GLOVE BIOGEL PI IND STRL 9 (GLOVE) ×1 IMPLANT
GLOVE BIOGEL PI INDICATOR 8 (GLOVE) ×2
GLOVE BIOGEL PI INDICATOR 9 (GLOVE) ×2
GOWN STRL REUS W/ TWL LRG LVL3 (GOWN DISPOSABLE) ×1 IMPLANT
GOWN STRL REUS W/ TWL XL LVL3 (GOWN DISPOSABLE) ×2 IMPLANT
GOWN STRL REUS W/TWL LRG LVL3 (GOWN DISPOSABLE) ×3
GOWN STRL REUS W/TWL XL LVL3 (GOWN DISPOSABLE) ×6
KIT BASIN OR (CUSTOM PROCEDURE TRAY) ×3 IMPLANT
KIT ROOM TURNOVER OR (KITS) ×3 IMPLANT
MANIFOLD NEPTUNE II (INSTRUMENTS) ×3 IMPLANT
NDL 22X1.5 STRL (OR ONLY) (MISCELLANEOUS) ×2 IMPLANT
NEEDLE 22X1 1/2 OR ONLY (MISCELLANEOUS) ×4
NEEDLE 22X1.5 STRL (OR ONLY) (MISCELLANEOUS) ×2 IMPLANT
NS IRRIG 1000ML POUR BTL (IV SOLUTION) ×3 IMPLANT
PACK TOTAL JOINT (CUSTOM PROCEDURE TRAY) ×3 IMPLANT
PAD ARMBOARD 7.5X6 YLW CONV (MISCELLANEOUS) ×6 IMPLANT
SAW OSC TIP CART 19.5X105X1.3 (SAW) ×3 IMPLANT
SUT ETHIBOND NAB CT1 #1 30IN (SUTURE) ×4 IMPLANT
SUT VIC AB 0 CT1 27 (SUTURE) ×3
SUT VIC AB 0 CT1 27XBRD ANBCTR (SUTURE) IMPLANT
SUT VIC AB 1 CTX 36 (SUTURE) ×3
SUT VIC AB 1 CTX36XBRD ANBCTR (SUTURE) ×1 IMPLANT
SUT VIC AB 2-0 CT1 27 (SUTURE) ×3
SUT VIC AB 2-0 CT1 TAPERPNT 27 (SUTURE) ×2 IMPLANT
SUT VIC AB 3-0 PS2 18 (SUTURE) ×3
SUT VIC AB 3-0 PS2 18XBRD (SUTURE) ×1 IMPLANT
SYR CONTROL 10ML LL (SYRINGE) ×6 IMPLANT
TOWEL OR 17X24 6PK STRL BLUE (TOWEL DISPOSABLE) ×3 IMPLANT
TOWEL OR 17X26 10 PK STRL BLUE (TOWEL DISPOSABLE) ×3 IMPLANT
TRAY CATH 16FR W/PLASTIC CATH (SET/KITS/TRAYS/PACK) ×2 IMPLANT
TRAY FOLEY CATH 14FR (SET/KITS/TRAYS/PACK) IMPLANT
WATER STERILE IRR 1000ML POUR (IV SOLUTION) ×1 IMPLANT

## 2015-12-12 NOTE — Transfer of Care (Signed)
Immediate Anesthesia Transfer of Care Note  Patient: Carl Small  Procedure(s) Performed: Procedure(s): TOTAL HIP ARTHROPLASTY ANTERIOR APPROACH (Right)  Patient Location: PACU  Anesthesia Type:MAC and Spinal  Level of Consciousness: awake, alert  and oriented  Airway & Oxygen Therapy: Patient Spontanous Breathing and Patient connected to face mask oxygen  Post-op Assessment: Report given to RN and Post -op Vital signs reviewed and stable  Post vital signs: Reviewed and stable  Last Vitals:  Vitals:   12/12/15 1105 12/12/15 1710  BP: (!) 153/90   Pulse: 90   Resp: 18   Temp: 37.1 C (P) 36.2 C    Last Pain:  Vitals:   12/12/15 1139  TempSrc:   PainSc: 9       Patients Stated Pain Goal: 4 (12/12/15 1139)  Complications: No apparent anesthesia complications

## 2015-12-12 NOTE — Discharge Instructions (Signed)

## 2015-12-12 NOTE — Interval H&P Note (Signed)
   History reviewed, patient examined, no change in status, stable for surgery.  

## 2015-12-12 NOTE — Anesthesia Procedure Notes (Signed)
Procedure Name: MAC Date/Time: 12/12/2015 3:00 PM Performed by: Lovie CholOCK, [place] K Pre-anesthesia Checklist: Patient identified, Emergency Drugs available, Suction available, Patient being monitored and Timeout performed Patient Re-evaluated:Patient Re-evaluated prior to inductionOxygen Delivery Method: Simple face mask

## 2015-12-12 NOTE — Progress Notes (Signed)
Patient concerned that family/friends will not be able to locate him after surgery due to XXX.  Patient has requested to establish password prior to surgery. Patient states that he would like his password to be [id number].

## 2015-12-12 NOTE — Anesthesia Preprocedure Evaluation (Addendum)
Anesthesia Evaluation  Patient identified by MRN, date of birth, ID band Patient awake    Reviewed: Allergy & Precautions, NPO status , Patient's Chart, lab work & pertinent test results  History of Anesthesia Complications Negative for: history of anesthetic complications  Airway Mallampati: II  TM Distance: >3 FB Neck ROM: Full    Dental  (+) Caps, Dental Advisory Given, Poor Dentition   Pulmonary Current Smoker,    breath sounds clear to auscultation       Cardiovascular hypertension, Pt. on medications  Rhythm:Regular Rate:Normal     Neuro/Psych negative neurological ROS     GI/Hepatic negative GI ROS, Neg liver ROS,   Endo/Other  negative endocrine ROS  Renal/GU Renal InsufficiencyRenal disease (creat 1.35)     Musculoskeletal  (+) Arthritis , Osteoarthritis,    Abdominal   Peds  Hematology negative hematology ROS (+)   Anesthesia Other Findings   Reproductive/Obstetrics                            Anesthesia Physical Anesthesia Plan  ASA: II  Anesthesia Plan: Spinal   Post-op Pain Management:    Induction:   Airway Management Planned: Natural Airway and Simple Face Mask  Additional Equipment:   Intra-op Plan:   Post-operative Plan:   Informed Consent: I have reviewed the patients History and Physical, chart, labs and discussed the procedure including the risks, benefits and alternatives for the proposed anesthesia with the patient or authorized representative who has indicated his/her understanding and acceptance.   Dental advisory given  Plan Discussed with: CRNA and Surgeon  Anesthesia Plan Comments: (Plan routine monitors, SAB)        Anesthesia Quick Evaluation

## 2015-12-12 NOTE — Anesthesia Postprocedure Evaluation (Signed)
Anesthesia Post Note  Patient: Carl Small  Procedure(s) Performed: Procedure(s) (LRB): TOTAL HIP ARTHROPLASTY ANTERIOR APPROACH (Right)  Patient location during evaluation: PACU Anesthesia Type: Spinal Level of consciousness: oriented, awake and alert and patient cooperative Pain management: pain level controlled Vital Signs Assessment: post-procedure vital signs reviewed and stable Respiratory status: spontaneous breathing, respiratory function stable and nonlabored ventilation Cardiovascular status: blood pressure returned to baseline and stable Postop Assessment: no headache, no backache, spinal receding and no signs of nausea or vomiting Anesthetic complications: no    Last Vitals:  Vitals:   12/12/15 2010 12/12/15 2109  BP: (!) 135/92 113/79  Pulse: 61 64  Resp: 12 14  Temp:  36.6 C    Last Pain:  Vitals:   12/12/15 1710  TempSrc:   PainSc: Asleep                 Carl Small,E. Khamani Fairley

## 2015-12-12 NOTE — Anesthesia Procedure Notes (Signed)
Spinal  Patient location during procedure: OR End time: 12/12/2015 3:02 PM Staffing Anesthesiologist: Jairo BenJACKSON, Tahnee Cifuentes Performed: anesthesiologist  Preanesthetic Checklist Completed: patient identified, site marked, surgical consent, pre-op evaluation, timeout performed, IV checked, risks and benefits discussed and monitors and equipment checked Spinal Block Patient position: sitting Prep: ChloraPrep and site prepped and draped Patient monitoring: heart rate, cardiac monitor, continuous pulse ox and blood pressure Approach: midline Location: L3-4 Needle Needle type: Quincke  Needle gauge: 25 G Needle length: 9 cm Additional Notes Pt identified in Operating room.  Monitors applied. Working IV access confirmed. Sterile prep, drape lumbar spine.  1% lido local L 3,4.  #25ga Quincke into clear CSF L 3,4.  20mg  0.5% Bupivacaine with asp CSF beginning and end of injection.  Patient asymptomatic, VSS, no heme aspirated, tolerated well.  Sandford Craze Lawonda Pretlow, MD

## 2015-12-12 NOTE — Progress Notes (Signed)
Patient states that he had 1/2 slice of pizza at 0200.  Dr. Jean RosenthalJackson notified.

## 2015-12-13 ENCOUNTER — Encounter (HOSPITAL_COMMUNITY): Payer: Self-pay | Admitting: Orthopedic Surgery

## 2015-12-13 LAB — CBC
HCT: 44.4 % (ref 39.0–52.0)
Hemoglobin: 14.6 g/dL (ref 13.0–17.0)
MCH: 29.5 pg (ref 26.0–34.0)
MCHC: 32.9 g/dL (ref 30.0–36.0)
MCV: 89.7 fL (ref 78.0–100.0)
PLATELETS: 184 10*3/uL (ref 150–400)
RBC: 4.95 MIL/uL (ref 4.22–5.81)
RDW: 13.6 % (ref 11.5–15.5)
WBC: 15 10*3/uL — ABNORMAL HIGH (ref 4.0–10.5)

## 2015-12-13 MED ORDER — CYCLOBENZAPRINE HCL 10 MG PO TABS
10.0000 mg | ORAL_TABLET | Freq: Four times a day (QID) | ORAL | Status: DC | PRN
  Administered 2015-12-13 – 2015-12-15 (×6): 10 mg via ORAL
  Filled 2015-12-13 (×6): qty 1

## 2015-12-13 MED ORDER — ZOLPIDEM TARTRATE 5 MG PO TABS
10.0000 mg | ORAL_TABLET | Freq: Every evening | ORAL | Status: DC | PRN
  Administered 2015-12-13 – 2015-12-14 (×2): 10 mg via ORAL
  Filled 2015-12-13 (×2): qty 2

## 2015-12-13 MED ORDER — OXYCODONE HCL ER 20 MG PO T12A
20.0000 mg | EXTENDED_RELEASE_TABLET | Freq: Two times a day (BID) | ORAL | Status: DC
  Administered 2015-12-13 – 2015-12-15 (×6): 20 mg via ORAL
  Filled 2015-12-13 (×6): qty 1

## 2015-12-13 NOTE — Progress Notes (Signed)
Pt arrived from PACU. VSS. Pt eating food his girlfriend brought. RN called into room pt rating pain 10/10. Pt states the dilaudid "made him itch too much" & pts oxycodone ir not due at that time. RN calls PA on call, receives orders for 2 mg morphine q4h. Orders carried out.   Pt called RN back in room one hour later w/ pt c/o pain 10/10. RN paged PA on call, received orders to modify morphine from q4h to q2h and administer 10 mg ambien QHS PRN as well as a one time dose of 2 mg morphine STAT. Pt walking about room and demanding to leave the unit because "yall aren't controlling my pain". Pt becoming verbally aggressive and using profanity towards staff and girlfriend. At this time pt received next dose of 10 mg oxy ir. Per pt- received no relief.   PA on call paged again, received orders to administer 20 mg oxycontin in addition to 2mg  morphine q2h. Pt refusing to sit in bed or chair. Requesting to "leave"; RN educated pt on the importance of staying inpatient. After some time PA on call paged once again, RN requested an increase in oxy to possibly 15 mg. PA felt uncomfortable at this time d/t amount of pain medication previously administered. PA then gave orders to administer 10 mg flexeril q6h. Orders carried out. Staff able to get pt back in to bed safely. Pt resting comfortably in bed.

## 2015-12-13 NOTE — Progress Notes (Signed)
Patient c/o pain not relieved by pain medications,took prn oxycodone in the room had to wake up patient for him to take medication.  Carl Small, Kae HellerMiranda Lynn, RN

## 2015-12-13 NOTE — Progress Notes (Signed)
Physical Therapy Treatment Patient Details Name: Carl Small MRN: [id number] DOB: 24-Nov-1975 Today's Date: 12/13/2015    History of Present Illness 40 y.o. male now s/p Rt direct anterior THA on 12/12/15. PMH: HTN.     PT Comments    Upon therapist entering room, pt found up talking on his phone and ambulating in room without an assistive device. Once found, pt limping back to chair to sit down with verbal expressions of pain. Following this, ambulation was performed with pt requesting use of rw. Initially pt dragging his Rt LE but with cues pt began to swing his Rt LE for improved gait pattern. While ambulating the pt had to be cued to stay awake and attend to task (twice). Pt reporting severe pain with ambulation but appearing to be resting comfortably in chair upon therapist leaving and meal arriving. PT to continue to follow in anticipation of D/C to home following acute stay.   Follow Up Recommendations  Home health PT;Supervision for mobility/OOB     Equipment Recommendations  Rolling walker with 5" wheels    Recommendations for Other Services       Precautions / Restrictions Precautions Precautions: Fall Precaution Comments: reviewed WBAT status with pt Restrictions Weight Bearing Restrictions: Yes RLE Weight Bearing: Weight bearing as tolerated    Mobility  Bed Mobility               General bed mobility comments: not performed during session  Transfers Overall transfer level: Needs assistance Equipment used: None Transfers: Sit to/from Stand Sit to Stand: Min guard         General transfer comment: sit/stand with min guard for safety.   Ambulation/Gait Ambulation/Gait assistance: Min guard Ambulation Distance (Feet): 60 Feet Assistive device: Rolling walker (2 wheeled) Gait Pattern/deviations: Step-to pattern;Trunk flexed;Decreased weight shift to right;Decreased stance time - right Gait velocity: slow pattern   General Gait Details: Initially  pt dragging Rt LE with steps with no noted attempt to advance. With cues, pt able to perform rt hip flexion for swing phase. Pt taking 4 standing breaks, leaning forward onto rw. Twice pt having to be cues to stay awake and attend to task.    Stairs            Wheelchair Mobility    Modified Rankin (Stroke Patients Only)       Balance Overall balance assessment: Needs assistance Sitting-balance support: No upper extremity supported Sitting balance-Leahy Scale: Good     Standing balance support: No upper extremity supported Standing balance-Leahy Scale: Fair Standing balance comment: pt able to stand without UE support static.                     Cognition Arousal/Alertness: Awake/alert Behavior During Therapy: Flat affect Overall Cognitive Status: Within Functional Limits for tasks assessed                      Exercises      General Comments        Pertinent Vitals/Pain Pain Assessment: 0-10 Pain Score: 8  Pain Location: Rt leg Pain Descriptors / Indicators: Aching;Guarding;Moaning;Grimacing Pain Intervention(s): Monitored during session    Home Living                      Prior Function            PT Goals (current goals can now be found in the care plan section) Acute Rehab PT Goals Patient  Stated Goal: get rid of the pain PT Goal Formulation: With patient Time For Goal Achievement: 12/27/15 Potential to Achieve Goals: Fair Progress towards PT goals: Progressing toward goals    Frequency    7X/week      PT Plan Current plan remains appropriate    Co-evaluation             End of Session Equipment Utilized During Treatment: Gait belt Activity Tolerance: Other (comment) (pt reports pain as limiting factor with mobility) Patient left: in chair;with call bell/phone within reach     Time: 1447-1502 PT Time Calculation (min) (ACUTE ONLY): 15 min  Charges:  $Gait Training: 8-22 mins                    G  Codes:      Christiane Ha, PT, CSCS Pager [phone] Office [phone]  12/13/2015, 3:14 PM

## 2015-12-13 NOTE — Telephone Encounter (Addendum)
Labs were obtained, however he was seen by another provider for clearance.  It appears that this was completed and he had surgical clearance.

## 2015-12-13 NOTE — Progress Notes (Signed)
PATIENT ID: Esperanza SheetsDuane L XXXBoone  MRN: [id number]  DOB/AGE:  38-Oct-1977 / 38 y.o.  1 Day Post-Op Procedure(s) (LRB): TOTAL HIP ARTHROPLASTY ANTERIOR APPROACH (Right)    PROGRESS NOTE Subjective: Patient is alert, oriented, x1 Nausea, no Vomiting, yes passing gas, . Taking PO well. Denies SOB, Chest or Calf Pain. Using Incentive Spirometer, PAS in place. Ambulate WBAT. When the spinal wore off.  The patient's pain  Immediately went to a 10 over 10.  Despite the use of OxyIR, hydromorphone,  And finally he received an OxyContin tablet as well asIV morphine 3, and his pain was then diminished to the point where he can sleep. This morning.  He still reports severe pain, although twice during the interview, while I was talking to him.  On further questioning, he reports that his mother was a drug addict and he had narcotics and assist him when he was born.  He also reports a years ago when he had a procedure large doses of narcotics would not control his pain as well. Reviewing the nurse's notes from last night it appears he may have been verbally abusive to his girlfriend and the staff and threatened to leave the hospital.  We had a discussion about the appropriate interaction was staff as well as the judicious use of narcotics after hip replacement.  .    Objective: Vital signs in last 24 hours: Vitals:   12/12/15 2109 12/12/15 2139 12/13/15 0048 12/13/15 0449  BP: 113/79 132/90 (!) 111/92 (!) 147/88  Pulse: 64 67 78 86  Resp: 14 16 20 19   Temp: 97.9 F (36.6 C) 97.8 F (36.6 C) 98.6 F (37 C) 98.8 F (37.1 C)  TempSrc:  Oral Oral Oral  SpO2: 98% 100% 96% 98%  Weight:      Height:          Intake/Output from previous day: I/O last 3 completed shifts: In: 1400 [I.V.:1400] Out: 600 [Urine:400; Blood:200]   Intake/Output this shift: No intake/output data recorded.   LABORATORY DATA:  Recent Labs  12/13/15 0544  WBC 15.0*  HGB 14.6  HCT 44.4  PLT 184     Examination: Neurologically intact ABD soft Neurovascular intact Sensation intact distally Intact pulses distally Dorsiflexion/Plantar flexion intact Incision: dressing C/D/I No cellulitis present Compartment soft} XR AP&Lat of hip shows well placed\fixed THA  Assessment:   1 Day Post-Op Procedure(s) (LRB): TOTAL HIP ARTHROPLASTY ANTERIOR APPROACH (Right) ADDITIONAL DIAGNOSIS:  Expected Acute Blood Loss Anemia, Hypertension,Tobacco usage, high tolerance for narcotics  Plan: PT/OT WBAT, THA  DVT Prophylaxis: SCDx72 hrs, ASA 325 mg BID x 2 weeks  DISCHARGE PLAN: Home  DISCHARGE NEEDS: HHPT, Walker and 3-in-1 comode seat

## 2015-12-13 NOTE — Progress Notes (Addendum)
I spoke with patient about his concern.  He stated that his pain had not been managed and that he did not appreciate the way the Dr. Sherron MondaySpoke with him, and what the nurses had written about him.  I reviewed the progress notes and in formed him that I did not see anything in appropriate in the progress notes.  That the notes were very objective and that the RN did everything appropriately as far as trying to get his pain managed.  I informed him that the nurse made several calls, received orders for a different pain med and also had frequency changed to more often to provide relief.  Pt states that his pain is better today, stating that it just burns.  I explained that that is a normal feeling and that we cannot make him completely pain free as he has just had surgery and some pain/soreness is to be expected. I informed him that I understood his concern with his high pain tolerance, and that he was receiving medication that should be effective and that we did not want to give him too much medication and then have to give a reversal agent as that would make the pain even worse.  Pt understood and closed his eyes as if starting to doze off twice. Also informed patient to call us before he gets up because we do not want him to have an accident/fall. Explained that with the medications he is taking he is at a higher risk of falling.  Pt voiced understanding.

## 2015-12-13 NOTE — Evaluation (Signed)
Physical Therapy Evaluation Patient Details Name: Carl Small MRN: [id number] DOB: [birth year] Today's Date: 12/13/2015   History of Present Illness  40 y.o. male now s/p Rt direct anterior THA on 12/12/15. PMH: HTN.   Clinical Impression  Mobility limited during PT session with pt complaining of severe pain (10/10). Nursing reports that all medication has been dispensed. The patient was willing to attempt limited mobility in the chair to assist with positioning but he refused attempting to stand at this time due to pain. While nursing present and at beginning of session, pt moaning loudly and crying with reports of severe pain. By the time PT session ended, pt falling asleep in chair. Will continue to follow and progress mobility. Pt does report that he lives alone and is currently unsure if anyone will be staying with him at D/C. As the patient's pain becomes better controlled, anticipate that the pt will D/C to home with assistance.      Follow Up Recommendations Home health PT;Supervision for mobility/OOB    Equipment Recommendations  Rolling walker with 5" wheels    Recommendations for Other Services       Precautions / Restrictions Precautions Precautions: Fall Restrictions Weight Bearing Restrictions: Yes RLE Weight Bearing: Weight bearing as tolerated      Mobility  Bed Mobility               General bed mobility comments: Pt sitting partially in chair upon arrival with nursing. Nursing reports that she had recently assisted him to the chair from bed using rw. Nursing reports that the pt was unable to stand upright fully.   Transfers                 General transfer comment: Pt refusing to attempt standing due to pain. Pt able to use UEs to slide laterally and posteriorly in chair.   Ambulation/Gait                Stairs            Wheelchair Mobility    Modified Rankin (Stroke Patients Only)       Balance Overall balance  assessment: Needs assistance Sitting-balance support: No upper extremity supported Sitting balance-Leahy Scale: Good                                       Pertinent Vitals/Pain Pain Assessment: 0-10 Pain Score: 10-Worst pain ever Pain Location: Rt hip and thigh Pain Descriptors / Indicators: Burning;Aching;Constant;Crying;Moaning Pain Intervention(s): Monitored during session;Limited activity within patient's tolerance;Ice applied;Repositioned    Home Living Family/patient expects to be discharged to:: Private residence Living Arrangements: Alone   Type of Home: House Home Access: Stairs to enter Entrance Stairs-Rails: None Entrance Stairs-Number of Steps: 3-4 Home Layout: One level Home Equipment: Cane - single point Additional Comments: Pt reports that the person who was going to stay with him might not work out now. He states that he is going to make some calls later to see if someone else can stay with him.     Prior Function Level of Independence: Independent with assistive device(s)         Comments: reports using SPC for ambulation     Hand Dominance        Extremity/Trunk Assessment   Upper Extremity Assessment: Overall WFL for tasks assessed           Lower Extremity  Assessment: RLE deficits/detail RLE Deficits / Details: assist needed to move and lift Rt LE with pt reporting pain as limiting factor.        Communication   Communication: No difficulties  Cognition Arousal/Alertness: Awake/alert Behavior During Therapy: Agitated Overall Cognitive Status: Within Functional Limits for tasks assessed                      General Comments      Exercises Total Joint Exercises Quad Sets: Other (comment) (attempted, pt reports having too much pain to cont. )   Assessment/Plan    PT Assessment Patient needs continued PT services  PT Problem List Decreased strength;Decreased range of motion;Decreased activity  tolerance;Decreased balance;Decreased mobility;Pain          PT Treatment Interventions DME instruction;Gait training;Stair training;Functional mobility training;Therapeutic activities;Therapeutic exercise;Balance training;Patient/family education    PT Goals (Current goals can be found in the Care Plan section)  Acute Rehab PT Goals Patient Stated Goal: have less pain PT Goal Formulation: With patient Time For Goal Achievement: 12/27/15 Potential to Achieve Goals: Fair    Frequency 7X/week   Barriers to discharge Decreased caregiver support      Co-evaluation               End of Session   Activity Tolerance: Patient limited by pain Patient left: in chair;with call bell/phone within reach Nurse Communication: Mobility status         Time: 0981-1914 PT Time Calculation (min) (ACUTE ONLY): 20 min   Charges:   PT Evaluation $PT Eval Moderate Complexity: 1 Procedure     PT G Codes:        Christiane Ha, PT, CSCS Pager [phone] Office [phone]  12/13/2015, 10:13 AM

## 2015-12-13 NOTE — Op Note (Addendum)
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 [employer]  (present throughout entire procedure and necessary for timely completion of the procedure)   ANESTHESIA: spinal BLOOD LOSS: 300 FLUID REPLACEMENT: 1800 crystalloid Antibiotic: 1.5gm vancomycin Tranexamic Acid: 1gm iv, 2gm topical COMPLICATIONS: none    INDICATIONS FOR PROCEDURE: A 40 y.o. 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 Despite conservative measures with observation, anti-inflammatory medicine, narcotics, use of a cane, has severe unremitting pain and can ambulate only a few blocks before resting. Patient desires elective R total hip arthroplasty to decrease pain and increase function. The risks, benefits, and alternatives were discussed at length including but not limited to the risks of infection, bleeding, nerve injury, stiffness, blood clots, the need for revision surgery, cardiopulmonary complications, among others, and they were willing to proceed. Questions answered     PROCEDURE IN DETAIL: The patient was identified by armband,  received preoperative IV antibiotics in the holding area at [facility], taken to the operating room , appropriate anesthetic monitors  were attached and  anesthesia was induced with the patienton the gurney. The HANA boots were applied to the feet and he was then transferred to the HANA table with a  peroneal post and support underneath the non-operative le, which was locked in 5 lb traction. Theoperative lower extremity was then prepped and draped in the usual sterile fashion from just above the iliac crest to the knee. And a timeout procedure was performed. We then made a 12 cm incision along the interval at the leading edge of the tensor fascia lata of starting at 2 cm lateral to and 2 cm distal to the ASIS. Small bleeders in the skin and subcutaneous tissue identified and cauterized we dissected down to the fascia and made an incision in the fascia allowing [place] to elevate the fascia of the tensor muscle and exploited the interval between the rectus and the tensor fascia lata. A Hohmann retractor was then placed along the superior neck of the femur and a Cobra retractor along the inferior neck of the femur we teed the capsule starting out at the superior anterior aspect of the acetabulum going distally and made the T along the neck both leaflets of the T were tagged with #2 Ethibond suture. Cobra retractors were then placed along the inferior and superior  neck allowing us to perform a standard neck cut and removed the femoral head with a power corkscrew. We then placed a right angle Hohmann retractor along the anterior aspect of the acetabulum a spiked Cobra in the cotyloid notch and posteriorly a Muelller retractor. We then sequentially reamed up to a 51 mm basket reamer obtaining good coverage in all quadrants, verified by C-arm imaging. Under C-arm control with and hammered into place a 52 mm Pinnacle cup in 45 of abduction and 15 of anteversion. The cup seated nicely and required no supplemental screws. We then placed a central hole Eliminator and a 0 polyethylene liner. The foot was then externally rotated to 110, the HANA elevator was placed around the flare of the greater trochanter and the limb was extended and abducted delivering the proximal femur up into the wound. A medium Hohmann retractor was  placed over the greater trochanter and a Mueller retractor along the posterior femoral neck completing the exposure. We then performed releases superiorly and and inferiorly of the capsule going back to the pirformis fossa superiorly and to the lesser trochanter inferiorly. We then entered the proximal femur with the box cutting offset chisel followed by, a canal sounder, the chili pepper and broaching up to a 7 broach. This seated nicely and we reamed the calcar. A trial reduction was performed with a 1.5 mm 36 mm head.The limb lengths were excellent the hip was stable in 90 of external rotation. At this point the trial components removed and we hammered into place a # 7 Tri-Lock stem with [employer] coating. This was a hi offset stem and a + 1.5 36 mm ceramic ball was then hammered into place the hip was reduced and final C-arm images obtained. The wound was thoroughly irrigated with normal saline solution. We repaired the ant capsule and the tensor fascia lot a with running 0 vicryl suture. the subcutaneous tissue was closed with 2-0 and 3-0 Vicryl suture followed by an Aquacil dressing. At this point the patient was awaken and transferred to hospital gurney without difficulty. The subcutaneous tissue with 0 and 2-0 undyed Vicryl suture and the skin with running  3-0 vicryl subcuticular suture. Aquacil dressing was applied. The patient was then unclamped, rolled supine, awaken extubated and taken to recovery room without difficulty in stable condition.   Carl Small 12/13/2015, 6:48 AM

## 2015-12-13 NOTE — Progress Notes (Signed)
Patient has been in room walking around without walker patient has been told not to get up without calling for help and that he also needs to use the walker for safety. Patient requesting more pain medications took in ordered morphine and patient was falling to sleep while I was talking to him, but then will wake up and say his pain has not changed with all the medications given to him.ask patient has his pain improved any today and he said "prefer not to say."

## 2015-12-13 NOTE — Progress Notes (Signed)
Patient up ambulating in hall tried to leave unit 2 times was ask by the charge nurse not to leave and also I asked him to stay on unit because with the amount of pain madications and just having surgery it was not safe for patient to be off unit as he is a very high risk for falls. Patient turn around and ambulated to the other end of hall sat on bench and talked on the phone the entire time "he complaining how we was treating him and the surgery was $40,000 and he should be receiving better treatment. He said he feels like we keeping him in prison."  Marq Rebello, Kae HellerMiranda Lynn, RN

## 2015-12-14 ENCOUNTER — Encounter (HOSPITAL_COMMUNITY): Payer: Self-pay

## 2015-12-14 ENCOUNTER — Inpatient Hospital Stay (HOSPITAL_COMMUNITY): Payer: BC Managed Care – PPO

## 2015-12-14 DIAGNOSIS — D72829 Elevated white blood cell count, unspecified: Secondary | ICD-10-CM

## 2015-12-14 DIAGNOSIS — K920 Hematemesis: Secondary | ICD-10-CM

## 2015-12-14 DIAGNOSIS — I10 Essential (primary) hypertension: Secondary | ICD-10-CM

## 2015-12-14 DIAGNOSIS — M87051 Idiopathic aseptic necrosis of right femur: Secondary | ICD-10-CM

## 2015-12-14 LAB — CBC
HCT: 44.2 % (ref 39.0–52.0)
Hemoglobin: 14.4 g/dL (ref 13.0–17.0)
MCH: 29.6 pg (ref 26.0–34.0)
MCHC: 32.6 g/dL (ref 30.0–36.0)
MCV: 90.9 fL (ref 78.0–100.0)
PLATELETS: 215 10*3/uL (ref 150–400)
RBC: 4.86 MIL/uL (ref 4.22–5.81)
RDW: 13.6 % (ref 11.5–15.5)
WBC: 24 10*3/uL — AB (ref 4.0–10.5)

## 2015-12-14 LAB — URINALYSIS, ROUTINE W REFLEX MICROSCOPIC
BILIRUBIN URINE: NEGATIVE
GLUCOSE, UA: NEGATIVE mg/dL
HGB URINE DIPSTICK: NEGATIVE
KETONES UR: NEGATIVE mg/dL
Leukocytes, UA: NEGATIVE
Nitrite: NEGATIVE
PROTEIN: NEGATIVE mg/dL
Specific Gravity, Urine: 1.02 (ref 1.005–1.030)
pH: 6 (ref 5.0–8.0)

## 2015-12-14 LAB — COMPREHENSIVE METABOLIC PANEL
ALBUMIN: 3.9 g/dL (ref 3.5–5.0)
ALT: 27 U/L (ref 17–63)
AST: 36 U/L (ref 15–41)
Alkaline Phosphatase: 74 U/L (ref 38–126)
Anion gap: 11 (ref 5–15)
BUN: 17 mg/dL (ref 6–20)
CHLORIDE: 96 mmol/L — AB (ref 101–111)
CO2: 29 mmol/L (ref 22–32)
CREATININE: 1.43 mg/dL — AB (ref 0.61–1.24)
Calcium: 9.2 mg/dL (ref 8.9–10.3)
GFR calc Af Amer: >60 mL/min (ref >60)
GFR calc non Af Amer: >60 mL/min (ref >60)
GLUCOSE: 115 mg/dL — AB (ref 65–99)
Potassium: 3.4 mmol/L — ABNORMAL LOW (ref 3.5–5.1)
SODIUM: 136 mmol/L (ref 135–145)
Total Bilirubin: 0.7 mg/dL (ref 0.3–1.2)
Total Protein: 7.5 g/dL (ref 6.5–8.1)

## 2015-12-14 LAB — CBC WITH DIFFERENTIAL/PLATELET
BASOS PCT: 0 %
Basophils Absolute: 0 10*3/uL (ref 0.0–0.1)
EOS PCT: 0 %
Eosinophils Absolute: 0 10*3/uL (ref 0.0–0.7)
HCT: 44.7 % (ref 39.0–52.0)
Hemoglobin: 14.8 g/dL (ref 13.0–17.0)
LYMPHS ABS: 1.8 10*3/uL (ref 0.7–4.0)
Lymphocytes Relative: 7 %
MCH: 30.3 pg (ref 26.0–34.0)
MCHC: 33.1 g/dL (ref 30.0–36.0)
MCV: 91.6 fL (ref 78.0–100.0)
MONO ABS: 2.3 10*3/uL — AB (ref 0.1–1.0)
Monocytes Relative: 9 %
NEUTROS ABS: 21.4 10*3/uL — AB (ref 1.7–7.7)
NEUTROS PCT: 84 %
PLATELETS: 222 10*3/uL (ref 150–400)
RBC: 4.88 MIL/uL (ref 4.22–5.81)
RDW: 13.7 % (ref 11.5–15.5)
WBC: 25.5 10*3/uL — ABNORMAL HIGH (ref 4.0–10.5)

## 2015-12-14 LAB — APTT: aPTT: 32 seconds (ref 24–36)

## 2015-12-14 LAB — PROTIME-INR
INR: 1
PROTHROMBIN TIME: 13.2 s (ref 11.4–15.2)

## 2015-12-14 MED ORDER — SODIUM CHLORIDE 0.45 % IV SOLN
INTRAVENOUS | Status: DC
  Administered 2015-12-14 – 2015-12-15 (×2): via INTRAVENOUS

## 2015-12-14 MED ORDER — SUCRALFATE 1 GM/10ML PO SUSP
2.0000 g | Freq: Three times a day (TID) | ORAL | Status: DC
  Administered 2015-12-14 (×2): 2 g via ORAL
  Filled 2015-12-14 (×4): qty 20

## 2015-12-14 MED ORDER — PANTOPRAZOLE SODIUM 40 MG PO TBEC
80.0000 mg | DELAYED_RELEASE_TABLET | Freq: Every day | ORAL | Status: DC
  Administered 2015-12-14 – 2015-12-15 (×2): 80 mg via ORAL
  Filled 2015-12-14 (×3): qty 2

## 2015-12-14 MED ORDER — SODIUM CHLORIDE 0.9 % IV BOLUS (SEPSIS)
1000.0000 mL | Freq: Once | INTRAVENOUS | Status: AC
  Administered 2015-12-14: 1000 mL via INTRAVENOUS

## 2015-12-14 MED ORDER — HYDRALAZINE HCL 20 MG/ML IJ SOLN
5.0000 mg | INTRAMUSCULAR | Status: DC | PRN
Start: 2015-12-14 — End: 2015-12-15

## 2015-12-14 NOTE — Consult Note (Signed)
Medical Consultation   Carl Small  HYQ:[id number]  DOB: 10-28-1975  DOA: 12/12/2015  PCP: Magdalene River, PA-C   Outpatient Specialists: Gaylord Shih   Requesting physician: Minerva Areola - [facility]  Reason for consultation: Hematemesis   History of Present Illness: Carl Small is an 39 y.o. male w/ h/o HTN testicular torsion and AVN R hip, s/p R total hip arthroplasty POD #2. Consulted by primary team after pt hematemesis x2 day. Pt reports spitting up "potting soil" type materlia the past 2 days. Associated with increased reflux and esophagitis. Of note patient has this at baseline but this is worse than normal. Patient denies any chronic NSAID use. Patient has been on aspirin 325 since surgery. Patient typically takes Maalox home with improvement. Patient denies any malonic stool, hematochezia, bright red blood per rectum, large volume hematemesis, lightheadedness, palpitations, cardiac type chest pain, shortness of breath, fevers, productive cough. Patient does endorse occasional nonproductive cough which is associated with his heartburn.      Review of Systems:  ROS As per HPI otherwise 10 point review of systems negative.    Past Medical History: Past Medical History:  Diagnosis Date  . Hypertension   . Torsion of testicle     Past Surgical History: Past Surgical History:  Procedure Laterality Date  . FRACTURE SURGERY Right   . TESTICLE SURGERY  1996  . TOTAL HIP ARTHROPLASTY Right 12/12/2015   Procedure: TOTAL HIP ARTHROPLASTY ANTERIOR APPROACH;  Surgeon: Gean Birchwood, MD;  Location: [facility] OR;  Service: Orthopedics;  Laterality: Right;     Allergies:   Allergies  Allergen Reactions  . Penicillins Swelling    SWELLING REACTION UNSPECIFIED  Has patient had a PCN reaction causing immediate rash, facial/tongue/throat swelling, SOB or lightheadedness with hypotension: Yes Has patient had a PCN reaction causing severe rash involving mucus membranes or  skin necrosis: No Has patient had a PCN reaction that required hospitalization No Has patient had a PCN reaction occurring within the last 10 years: No If all of the above answers are "NO", then may proceed with Cephalosporin use.   . Sulfa Antibiotics Hives  . Amoxicillin Other (See Comments)    UNSPECIFIED REACTION  NOTE: PT HAS SWELLING WITH IV PCN's     Social History:  reports that he has been smoking.  He has a 4.50 pack-year smoking history. He has never used smokeless tobacco. He reports that he drinks about 7.2 oz of alcohol per week . He reports that he does not use drugs.   Family History: Family History  Problem Relation Age of Onset  . Heart disease Mother   . Hypertension Mother   . Diabetes Mother   . Epilepsy Mother      Physical Exam: Vitals:   12/13/15 0449 12/13/15 1350 12/13/15 2102 12/14/15 0334  BP: (!) 147/88 (!) 157/90 (!) 150/102 128/82  Pulse: 86 98 99 98  Resp: 19 18 18 17   Temp: 98.8 F (37.1 C) 98.9 F (37.2 C) 98.7 F (37.1 C) 98.7 F (37.1 C)  TempSrc: Oral  Oral Oral  SpO2: 98% 91% 96% 97%  Weight:      Height:        General:  Appears calm and comfortable Eyes:  PERRL, EOMI, normal lids, iris ENT:  grossly normal hearing, lips & tongue, mmm Neck:  no LAD, masses or thyromegaly Cardiovascular:  RRR, no m/r/g. No LE edema.  Respiratory:  CTA bilaterally, no w/r/r. Normal respiratory effort. Abdomen:  soft, ntnd, NABS Skin:  no rash or induration seen on limited exam Musculoskeletal: R hip w/ limited motion due to pain from recent surgery, no bony abnormality Psychiatric:  grossly normal mood and affect, speech fluent and appropriate, AOx3 Neurologic:  CN 2-12 grossly intact, moves all extremities in coordinated fashion, sensation intact  Data reviewed:  I have personally reviewed following labs and imaging studies Labs:  CBC:  Recent Labs Lab 12/09/15 1426 12/13/15 0544 12/14/15 0348 12/14/15 0945  WBC 6.3 15.0* 24.0*  25.5*  NEUTROABS 3.3  --   --  PENDING  HGB 17.0 14.6 14.4 14.8  HCT 51.1 44.4 44.2 44.7  MCV 89.3 89.7 90.9 91.6  PLT 200 184 215 222    Basic Metabolic Panel:  Recent Labs Lab 12/09/15 1426  NA 138  K 3.9  CL 106  CO2 25  GLUCOSE 145*  BUN 11  CREATININE 1.35*  CALCIUM 9.5   GFR Estimated Creatinine Clearance: 97.3 mL/min (by C-G formula based on SCr of 1.35 mg/dL (H)). Liver Function Tests: No results for input(s): AST, ALT, ALKPHOS, BILITOT, PROT, ALBUMIN in the last 168 hours. No results for input(s): LIPASE, AMYLASE in the last 168 hours. No results for input(s): AMMONIA in the last 168 hours. Coagulation profile  Recent Labs Lab 12/09/15 1426  INR 0.96    Cardiac Enzymes: No results for input(s): CKTOTAL, CKMB, CKMBINDEX, TROPONINI in the last 168 hours. BNP: Invalid input(s): POCBNP CBG: No results for input(s): GLUCAP in the last 168 hours. D-Dimer No results for input(s): DDIMER in the last 72 hours. Hgb A1c No results for input(s): HGBA1C in the last 72 hours. Lipid Profile No results for input(s): CHOL, HDL, LDLCALC, TRIG, CHOLHDL, LDLDIRECT in the last 72 hours. Thyroid function studies No results for input(s): TSH, T4TOTAL, T3FREE, THYROIDAB in the last 72 hours.  Invalid input(s): FREET3 Anemia work up No results for input(s): VITAMINB12, FOLATE, FERRITIN, TIBC, IRON, RETICCTPCT in the last 72 hours. Urinalysis    Component Value Date/Time   COLORURINE YELLOW 12/09/2015 1455   APPEARANCEUR CLEAR 12/09/2015 1455   LABSPEC 1.025 12/09/2015 1455   PHURINE 6.0 12/09/2015 1455   GLUCOSEU NEGATIVE 12/09/2015 1455   HGBUR NEGATIVE 12/09/2015 1455   BILIRUBINUR NEGATIVE 12/09/2015 1455   KETONESUR NEGATIVE 12/09/2015 1455   PROTEINUR NEGATIVE 12/09/2015 1455   UROBILINOGEN 1.0 02/22/2014 1538   NITRITE NEGATIVE 12/09/2015 1455   LEUKOCYTESUR NEGATIVE 12/09/2015 1455     Microbiology Recent Results (from the past 240 hour(s))  Surgical  pcr screen     Status: None   Collection Time: 12/09/15  2:38 PM  Result Value Ref Range Status   MRSA, PCR NEGATIVE NEGATIVE Final   Staphylococcus aureus NEGATIVE NEGATIVE Final    Comment:        The Xpert SA Assay (FDA approved for NASAL specimens in patients over 1 years of age), is one component of a comprehensive surveillance program.  Test performance has been validated by [facility] for patients greater than or equal to 72 year old. It is not intended to diagnose infection nor to guide or monitor treatment.        Inpatient Medications:   Scheduled Meds: . aspirin EC  325 mg Oral Q breakfast  . docusate sodium  100 mg Oral BID  . hydrochlorothiazide  25 mg Oral Daily  . oxyCODONE  20 mg Oral Q12H  . pantoprazole  80 mg Oral Daily  .  sodium chloride  1,000 mL Intravenous Once  . sucralfate  2 g Oral TID WC & HS   Continuous Infusions: . sodium chloride       Radiological Exams on Admission: Dg C-arm 1-60 Min  Result Date: 12/12/2015 CLINICAL DATA:  Right hip arthroplasty. EXAM: OPERATIVE RIGHT HIP (WITH PELVIS IF PERFORMED) 2 VIEWS TECHNIQUE: Fluoroscopic spot image(s) were submitted for interpretation post-operatively. COMPARISON:  11/16/2015. FINDINGS: Total right hip replacement. Hardware intact. Good anatomic alignment. No acute bony abnormality . Two views. 0 minutes 23 seconds fluoroscopy time. IMPRESSION: Total right hip replacement. Electronically Signed   By: Maisie Fus  Register   On: 12/12/2015 16:42   Dg Hip Operative Unilat With Pelvis Right  Result Date: 12/12/2015 CLINICAL DATA:  Right hip arthroplasty. EXAM: OPERATIVE RIGHT HIP (WITH PELVIS IF PERFORMED) 2 VIEWS TECHNIQUE: Fluoroscopic spot image(s) were submitted for interpretation post-operatively. COMPARISON:  11/16/2015. FINDINGS: Total right hip replacement. Hardware intact. Good anatomic alignment. No acute bony abnormality . Two views. 0 minutes 23 seconds fluoroscopy time. IMPRESSION: Total  right hip replacement. 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 POD #2.  - mgt per primary team  Hematemesis: likely secnodary to PUD. Hgb 14 - baseline. Chronic h/o GERD. No previous GI workup. Limited episodes during admission. Hgb stable. Smoker and ETOH use. - Repeat CBC, CMET - Protonix, carafate - Hpylori - Outpt GI workup  Leukocytosis: 24 on 9/20. AFVSS. Post op related vs infectious vs hemoconcentration - repeat CBC - CXR - IVF  HTN: elevated - continue HCTZ - Hydralazine PRN    Thank you for this consultation.  Our [facility] hospitalist team will follow the patient with you.   Marabelle Cushman J M.D. [facility] 12/14/2015, 10:24 AM

## 2015-12-14 NOTE — Care Management Note (Signed)
Case Management Note  Patient Details  Name: Esperanza SheetsDuane L XXXBoone MRN: [id number] Date of Birth: [birth year]  Subjective/Objective:  40 yr old male s/p right toal hip anterior approach.                  Action/Plan: Case manager spoke with patient concerning home health and DME needs. Choice for Home Health agency was offered. Referral was called to Josph MachoSusan Hamilton, [facility] Liaison. DME has been delivered to patient's room. Patient is trying to arrange for a friend to assist him at discharge.    Expected Discharge Date:    12/14/15              Expected Discharge Plan:  Home w Home Health Services  In-House Referral:     Discharge planning Services  CM Consult  Post Acute Care Choice:  Durable Medical Equipment, Home Health Choice offered to:  Patient  DME Arranged:  3-N-1, Walker rolling DME Agency:  [employer]  HH Arranged:  PT HH Agency:  [facility]  Status of Service:  Completed, signed off  If discussed at Long Length of Stay Meetings, dates discussed:    Additional Comments:  Durenda GuthrieBrady, Bandon Sherwin Naomi, RN 12/14/2015, 10:32 AM

## 2015-12-14 NOTE — Evaluation (Signed)
Occupational Therapy Evaluation and Discharge Patient Details Name: Carl SheetsDuane L XXXBoone MRN: [id number] DOB: June 20, 1975 Today's Date: 12/14/2015    History of Present Illness 40 y.o. male now s/p Rt direct anterior THA on 12/12/15. PMH: HTN.    Clinical Impression   Pt reports he was independent with ADL PTA. Currently pt overall supervision for safety with ADL and functional mobility with the exception of min assist for tub transfer with use of 3 in 1. Pts girlfriend present and educated on how she can assist upon return home. Pt planning to d/c home with 24/7 supervision initially from his girlfriend. No further acute OT needs identified; signing off at this time. Please re-consult if needs change. Thank you for this referral.    Follow Up Recommendations  No OT follow up;Supervision/Assistance - 24 hour (initially)    Equipment Recommendations  3 in 1 bedside comode (3 in 1 already delivered to room)    Recommendations for Other Services       Precautions / Restrictions Precautions Precautions: Fall Precaution Comments: reviewed WBAT status with pt Restrictions Weight Bearing Restrictions: Yes RLE Weight Bearing: Weight bearing as tolerated      Mobility Bed Mobility Overal bed mobility: Needs Assistance Bed Mobility: Sit to Supine       Sit to supine: Min assist   General bed mobility comments: Light min assist for RLE into bed. Pt able to reposition self independently without use of bed rails.  Transfers Overall transfer level: Needs assistance Equipment used: Rolling walker (2 wheeled) Transfers: Sit to/from Stand Sit to Stand: Supervision         General transfer comment: VCs for hand placement    Balance Overall balance assessment: Needs assistance Sitting-balance support: Feet supported;No upper extremity supported Sitting balance-Leahy Scale: Good     Standing balance support: During functional activity Standing balance-Leahy Scale: Fair                               ADL Overall ADL's : Needs assistance/impaired Eating/Feeding: Set up;Sitting   Grooming: Supervision/safety;Standing   Upper Body Bathing: Set up;Supervision/ safety;Sitting   Lower Body Bathing: Supervison/ safety;Sit to/from stand   Upper Body Dressing : Set up;Supervision/safety;Sitting   Lower Body Dressing: Supervision/safety;Sit to/from stand   Toilet Transfer: Supervision/safety;Ambulation;BSC;RW Toilet Transfer Details (indicate cue type and reason): Simulated by sit to stand from EOB with functional mobility. Toileting- Clothing Manipulation and Hygiene: Supervision/safety;Sit to/from stand   Tub/ Shower Transfer: Tub transfer;Ambulation;3 in Market researcher1;Rolling walker;Minimal assistance Tub/Shower Transfer Details (indicate cue type and reason): Educated pt and girlfriend on use of 3 in 1 in tub, need for supervision for transfers, and tub transfer technique. Pt able to return demo with min assist for RLE over edge of tub. Functional mobility during ADLs: Supervision/safety;Rolling walker General ADL Comments: Educated pt on R leg first into clothing; pt able to return demo with boxers. Discussed use of 3 in 1 over toilet and in tub as a seat. Provided handout for use of 3 in 1 as a seat in the tub.     Vision Vision Assessment?: No apparent visual deficits   Perception     Praxis      Pertinent Vitals/Pain Pain Assessment: Faces Faces Pain Scale: Hurts even more Pain Location: R hip Pain Descriptors / Indicators: Aching;Grimacing;Guarding Pain Intervention(s): Monitored during session;Repositioned;Ice applied     Hand Dominance     Extremity/Trunk Assessment Upper Extremity Assessment Upper Extremity  Assessment: Overall WFL for tasks assessed   Lower Extremity Assessment Lower Extremity Assessment: Defer to PT evaluation   Cervical / Trunk Assessment Cervical / Trunk Assessment: Normal   Communication  Communication Communication: No difficulties   Cognition Arousal/Alertness: Awake/alert Behavior During Therapy: WFL for tasks assessed/performed Overall Cognitive Status: Within Functional Limits for tasks assessed                     General Comments       Exercises       Shoulder Instructions      Home Living Family/patient expects to be discharged to:: Private residence Living Arrangements: Alone Available Help at Discharge: Family;Available 24 hours/day (initially) Type of Home: House Home Access: Stairs to enter [employer] of Steps: 3-4 Entrance Stairs-Rails: None Home Layout: One level     Bathroom Shower/Tub: Tub/shower unit Shower/tub characteristics: Curtain Firefighter: Standard     Home Equipment: Cane - single point   Additional Comments: Girlfriend present on OT eval; reports she is planning to stay with pt for the first few days. 3 in 1 delivered to room.      Prior Functioning/Environment Level of Independence: Independent with assistive device(s)        Comments: reports using SPC for ambulation        OT Problem List:     OT Treatment/Interventions:      OT Goals(Current goals can be found in the care plan section) Acute Rehab OT Goals Patient Stated Goal: home today OT Goal Formulation: All assessment and education complete, DC therapy  OT Frequency:     Barriers to D/C:            Co-evaluation              End of Session Equipment Utilized During Treatment: Rolling walker  Activity Tolerance: Patient tolerated treatment well Patient left: in bed;with call bell/phone within reach;with family/visitor present   Time: 1540-1603 OT Time Calculation (min): 23 min Charges:  OT General Charges $OT Visit: 1 Procedure OT Evaluation $OT Eval Moderate Complexity: 1 Procedure OT Treatments $Self Care/Home Management : 8-22 mins G-Codes:     Gaye Alken M.S., OTR/L Pager: [phone]  12/14/2015, 4:14  PM

## 2015-12-14 NOTE — Progress Notes (Signed)
Physical Therapy Treatment Patient Details Name: Esperanza SheetsDuane L XXXBoone MRN: [id number] DOB: 23-May-1975 Today's Date: 12/14/2015    History of Present Illness 40 y.o. male now s/p Rt direct anterior THA on 12/12/15. PMH: HTN.     PT Comments    Pt with improved ambulation tolerance today going 150 feet with rw. Pt states that he has a friend coming to stay with him once he leaves the hospital. PT to continue to follow and progress mobility as tolerated.   Follow Up Recommendations  Home health PT;Supervision for mobility/OOB     Equipment Recommendations  Rolling walker with 5" wheels    Recommendations for Other Services       Precautions / Restrictions Precautions Precautions: Fall Restrictions Weight Bearing Restrictions: Yes RLE Weight Bearing: Weight bearing as tolerated    Mobility  Bed Mobility Overal bed mobility: Independent             General bed mobility comments: supine<>sit  Transfers Overall transfer level: Needs assistance Equipment used: None Transfers: Sit to/from Stand Sit to Stand: Supervision         General transfer comment: min guard for safety  Ambulation/Gait Ambulation/Gait assistance: Supervision Ambulation Distance (Feet): 150 Feet Assistive device: Rolling walker (2 wheeled) Gait Pattern/deviations: Step-through pattern;Decreased stance time - right;Decreased weight shift to right;Trunk flexed Gait velocity: decreased   General Gait Details: cues for posture and encouraging weightbearing through Rt LE.    Stairs            Wheelchair Mobility    Modified Rankin (Stroke Patients Only)       Balance Overall balance assessment: Needs assistance Sitting-balance support: No upper extremity supported Sitting balance-Leahy Scale: Good     Standing balance support: During functional activity Standing balance-Leahy Scale: Fair Standing balance comment: able to stand static without UE support                     Cognition Arousal/Alertness: Awake/alert Behavior During Therapy: WFL for tasks assessed/performed Overall Cognitive Status: Within Functional Limits for tasks assessed                      Exercises      General Comments        Pertinent Vitals/Pain Pain Assessment: Faces Faces Pain Scale: Hurts even more Pain Location: Rt LE Pain Descriptors / Indicators: Guarding;Grimacing Pain Intervention(s): Limited activity within patient's tolerance;Monitored during session    Home Living                      Prior Function            PT Goals (current goals can now be found in the care plan section) Acute Rehab PT Goals Patient Stated Goal: planning to return home PT Goal Formulation: With patient Time For Goal Achievement: 12/27/15 Potential to Achieve Goals: Fair Progress towards PT goals: Progressing toward goals    Frequency    7X/week      PT Plan Current plan remains appropriate    Co-evaluation             End of Session Equipment Utilized During Treatment:  (pt declined use of gait belt) Activity Tolerance: Patient tolerated treatment well Patient left: in bed;with call bell/phone within reach (pt requests return to bed)     Time: [id number] PT Time Calculation (min) (ACUTE ONLY): 12 min  Charges:  $Gait Training: 8-22 mins  G Codes:      Christiane Ha, PT, CSCS Pager [phone] Office [phone]  12/14/2015, 10:49 AM

## 2015-12-14 NOTE — Progress Notes (Signed)
Physical Therapy Treatment Patient Details Name: Carl Small MRN: [id number] DOB: [birth year] Today's Date: 12/14/2015    History of Present Illness 40 y.o. male now s/p Rt direct anterior THA on 12/12/15. PMH: HTN.     PT Comments    Pt reports feeling like he can go home today, he denies any questions or concerns. Pt able to perform stairs and ambulate at this time. Recommending HHPT services to continue to follow. Pt confirms that he does have help arranged at home following his D/C. PT to continue to follow to progress mobility.   Follow Up Recommendations  Home health PT;Supervision for mobility/OOB     Equipment Recommendations  Rolling walker with 5" wheels    Recommendations for Other Services       Precautions / Restrictions Precautions Precautions: Fall Precaution Comments: reviewed WBAT status with pt Restrictions Weight Bearing Restrictions: Yes RLE Weight Bearing: Weight bearing as tolerated    Mobility  Bed Mobility Overal bed mobility: Independent             General bed mobility comments: supine to sit  Transfers Overall transfer level: Needs assistance Equipment used: None Transfers: Sit to/from Stand Sit to Stand: Supervision         General transfer comment: supervision for safety  Ambulation/Gait Ambulation/Gait assistance: Supervision Ambulation Distance (Feet): 110 Feet Assistive device: Rolling walker (2 wheeled) Gait Pattern/deviations: Step-to pattern;Trunk flexed Gait velocity: decreased   General Gait Details: cues for posture and encouraging weightbearing through RT LE   Stairs Stairs: Yes Stairs assistance: Min guard Stair Management: No rails;Forwards;With cane;Step to pattern Number of Stairs: 2 General stair comments: Pt reports feeling confident with ability to go up/down stairs to enter his home. Pt declines performing any more stairs to practive. Supportive friend present at session and confirms that they will  be around to help when he goes home.   Wheelchair Mobility    Modified Rankin (Stroke Patients Only)       Balance Overall balance assessment: Needs assistance Sitting-balance support: No upper extremity supported Sitting balance-Leahy Scale: Good     Standing balance support: During functional activity Standing balance-Leahy Scale: Fair                      Cognition Arousal/Alertness: Awake/alert Behavior During Therapy: WFL for tasks assessed/performed Overall Cognitive Status: Within Functional Limits for tasks assessed                      Exercises      General Comments General comments (skin integrity, edema, etc.): Verbally reviewed HEP with pt, pt declines need to perform. Using handout for reference      Pertinent Vitals/Pain Pain Assessment: Faces Faces Pain Scale: Hurts little more Pain Location: Rt hip Pain Descriptors / Indicators: Guarding;Aching Pain Intervention(s): Limited activity within patient's tolerance;Monitored during session    Home Living                      Prior Function            PT Goals (current goals can now be found in the care plan section) Acute Rehab PT Goals Patient Stated Goal: planning to return home PT Goal Formulation: With patient Time For Goal Achievement: 12/27/15 Potential to Achieve Goals: Fair Progress towards PT goals: Progressing toward goals    Frequency    7X/week      PT Plan Current plan remains appropriate  Co-evaluation             End of Session Equipment Utilized During Treatment:  (pt refused use of gait belt) Activity Tolerance: Patient tolerated treatment well Patient left: in bed;with family/visitor present (sitting EOB with OT)     Time: 3151-7616 PT Time Calculation (min) (ACUTE ONLY): 13 min  Charges:  $Gait Training: 8-22 mins                    G Codes:      Christiane Ha, PT, CSCS Pager [phone] Office [phone]  12/14/2015,  3:50 PM

## 2015-12-14 NOTE — Progress Notes (Signed)
Pt up in room ambulating to bathroom  without walker. Reminded pt to use walker when up.

## 2015-12-14 NOTE — Progress Notes (Signed)
PATIENT ID: Carl Small  MRN: [id number]  DOB/AGE:  [birth year] / 40 y.o.  2 Days Post-Op Procedure(s) (LRB): TOTAL HIP ARTHROPLASTY ANTERIOR APPROACH (Right)    PROGRESS NOTE Subjective: Patient is alert, oriented, no Nausea, no Vomiting, yes passing gas, . Taking PO well with small bites. Denies SOB, Chest or Calf Pain. Using Incentive Spirometer, PAS in place. Ambulate WBAT with pt walking 60 ft with therapy Patient reports pain as  7/10  .    Pt does complain of heartburn and when he was in the shower had an episode of emesis, that looked like coffee grounds.  He denies any episodes prior to his admission.  Objective: Vital signs in last 24 hours: Vitals:   12/13/15 0449 12/13/15 1350 12/13/15 2102 12/14/15 0334  BP: (!) 147/88 (!) 157/90 (!) 150/102 128/82  Pulse: 86 98 99 98  Resp: 19 18 18 17   Temp: 98.8 F (37.1 C) 98.9 F (37.2 C) 98.7 F (37.1 C) 98.7 F (37.1 C)  TempSrc: Oral  Oral Oral  SpO2: 98% 91% 96% 97%  Weight:      Height:          Intake/Output from previous day: No intake/output data recorded.   Intake/Output this shift: No intake/output data recorded.   LABORATORY DATA:  Recent Labs  12/13/15 0544 12/14/15 0348  WBC 15.0* 24.0*  HGB 14.6 14.4  HCT 44.4 44.2  PLT 184 215    Examination: Neurologically intact Neurovascular intact Sensation intact distally Intact pulses distally Dorsiflexion/Plantar flexion intact Incision: scant drainage No cellulitis present Compartment soft} XR AP&Lat of hip shows well placed\fixed THA  Assessment:   2 Days Post-Op Procedure(s) (LRB): TOTAL HIP ARTHROPLASTY ANTERIOR APPROACH (Right) ADDITIONAL DIAGNOSIS: Hypertension,Tobacco usage, high tolerance for narcotics    Plan: PT/OT WBAT, THA  DVT Prophylaxis: SCDx72 hrs, ASA 325 mg BID x 2 weeks  DISCHARGE PLAN: Home  DISCHARGE NEEDS: HHPT, Walker and 3-in-1 comode seat   We have consulted medicine to further work up the pt's coffee ground  emesis.

## 2015-12-15 DIAGNOSIS — R110 Nausea: Secondary | ICD-10-CM

## 2015-12-15 LAB — CBC
HEMATOCRIT: 42 % (ref 39.0–52.0)
HEMOGLOBIN: 13.6 g/dL (ref 13.0–17.0)
MCH: 29.4 pg (ref 26.0–34.0)
MCHC: 32.4 g/dL (ref 30.0–36.0)
MCV: 90.9 fL (ref 78.0–100.0)
Platelets: 221 10*3/uL (ref 150–400)
RBC: 4.62 MIL/uL (ref 4.22–5.81)
RDW: 13.8 % (ref 11.5–15.5)
WBC: 18.1 10*3/uL — ABNORMAL HIGH (ref 4.0–10.5)

## 2015-12-15 LAB — DIFFERENTIAL
BASOS ABS: 0 10*3/uL (ref 0.0–0.1)
BASOS PCT: 0 %
EOS ABS: 0.4 10*3/uL (ref 0.0–0.7)
EOS PCT: 2 %
Lymphocytes Relative: 14 %
Lymphs Abs: 2.6 10*3/uL (ref 0.7–4.0)
Monocytes Absolute: 2.1 10*3/uL — ABNORMAL HIGH (ref 0.1–1.0)
Monocytes Relative: 12 %
NEUTROS PCT: 72 %
Neutro Abs: 13 10*3/uL — ABNORMAL HIGH (ref 1.7–7.7)

## 2015-12-15 MED ORDER — METOCLOPRAMIDE HCL 5 MG PO TABS: 5.0000 mg | ORAL_TABLET | Freq: Three times a day (TID) | ORAL | 0 refills | Status: DC | PRN

## 2015-12-15 NOTE — Progress Notes (Signed)
PATIENT ID: Carl SheetsDuane L XXXBoone  MRN: [id number]  DOB/AGE:  08-08-1975 / 40 y.o.  3 Days Post-Op Procedure(s) (LRB): TOTAL HIP ARTHROPLASTY ANTERIOR APPROACH (Right)    PROGRESS NOTE Subjective: Patient is alert, oriented, no Nausea, no Vomiting, yes passing gas, . Taking PO with small bites. Denies SOB, Chest or Calf Pain. Using Incentive Spirometer, PAS in place. Ambulate WBAT with pt walking 150 ft with therapy and passing his therapy goals. Patient reports pain as  7/10.  U/A and chest xray negative and pt's WBC count dropped to 18.1.   Objective: Vital signs in last 24 hours: Vitals:   12/14/15 1617 12/14/15 1958 12/15/15 0358 12/15/15 0808  BP: (!) 147/99 (!) 154/92 (!) 148/78 (!) 152/88  Pulse: 78 98 100   Resp: 18 18 18    Temp: 98.1 F (36.7 C) 98.6 F (37 C) 98.8 F (37.1 C)   TempSrc: Oral Oral Oral   SpO2: 98% 100% 99%   Weight:      Height:          Intake/Output from previous day: I/O last 3 completed shifts: In: 2350 [P.O.:1450; I.V.:900] Out: -    Intake/Output this shift: No intake/output data recorded.   LABORATORY DATA:  Recent Labs  12/14/15 0945 12/15/15 0430  WBC 25.5* 18.1*  HGB 14.8 13.6  HCT 44.7 42.0  PLT 222 221  NA 136  --   K 3.4*  --   CL 96*  --   CO2 29  --   BUN 17  --   CREATININE 1.43*  --   GLUCOSE 115*  --   INR 1.00  --   CALCIUM 9.2  --     Examination: Neurologically intact Neurovascular intact Sensation intact distally Intact pulses distally Dorsiflexion/Plantar flexion intact Incision: dressing C/D/I No cellulitis present Compartment soft} XR AP&Lat of hip shows well placed\fixed THA  Assessment:   3 Days Post-Op Procedure(s) (LRB): TOTAL HIP ARTHROPLASTY ANTERIOR APPROACH (Right) ADDITIONAL DIAGNOSIS:  Expected Acute Blood Loss Anemia,  Hypertension,Tobacco usage, high tolerance for narcotics   Plan: PT/OT WBAT, THA  DVT Prophylaxis: SCDx72 hrs, ASA 325 mg BID x 2 weeks  DISCHARGE PLAN: Home, once pt  is cleared by hospitalist service  DISCHARGE NEEDS: HHPT, HHRN, Walker and 3-in-1 comode seat

## 2015-12-15 NOTE — Discharge Summary (Signed)
Patient ID: Carl Small MRN: [id number] DOB/AGE: 05-01-1975 40 y.o.  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 Hypertension   . Torsion of testicle     Surgeries: Procedure(s): TOTAL HIP ARTHROPLASTY ANTERIOR APPROACH on 12/12/2015   Consultants: Treatment Team:  Mahala Menghini, MD  Discharged Condition: Improved  Hospital Course: Carl Small is an 40 y.o. male who was admitted 12/12/2015 for operative treatment ofAvascular necrosis of femoral head (HCC). Patient has severe unremitting pain that affects sleep, daily activities, and work/hobbies. After pre-op clearance the patient was taken to the operating room on 12/12/2015 and underwent  Procedure(s): TOTAL HIP ARTHROPLASTY ANTERIOR APPROACH.    Patient was given perioperative antibiotics: Anti-infectives    Start     Dose/Rate Route Frequency Ordered Stop   12/12/15 1300  vancomycin (VANCOCIN) 1,500 mg in sodium chloride 0.9 % 500 mL IVPB     1,500 mg 250 mL/hr over 120 Minutes Intravenous To ShortStay Surgical 12/11/15 1220 12/12/15 1530       Patient was given sequential compression devices, early ambulation, and chemoprophylaxis to prevent DVT.  Patient benefited maximally from hospital stay and there were no complications.    Recent vital signs: Patient Vitals for the past 24 hrs:  BP Temp Temp src Pulse Resp SpO2  12/15/15 0808 (!) 152/88 - - - - -  12/15/15 0358 (!) 148/78 98.8 F (37.1 C) Oral 100 18 99 %  12/14/15 1958 (!) 154/92 98.6 F (37 C) Oral 98 18 100 %  12/14/15 1617 (!) 147/99 98.1 F (36.7 C) Oral 78 18 98 %     Recent laboratory studies:  Recent Labs  12/14/15 0945 12/15/15 0430  WBC 25.5* 18.1*   HGB 14.8 13.6  HCT 44.7 42.0  PLT 222 221  NA 136  --   K 3.4*  --   CL 96*  --   CO2 29  --   BUN 17  --   CREATININE 1.43*  --   GLUCOSE 115*  --   INR 1.00  --   CALCIUM 9.2  --      Discharge Medications:     Medication List    STOP taking these medications   HYDROcodone-acetaminophen 5-325 MG tablet Commonly known as:  NORCO   meloxicam 15 MG tablet Commonly known as:  MOBIC     TAKE these medications   aspirin EC 325 MG tablet Take 1 tablet (325 mg total) by mouth 2 (two) times daily.   hydrochlorothiazide 25 MG tablet Commonly known as:  HYDRODIURIL Take 1 tablet (25 mg total) by mouth daily.   methocarbamol 500 MG tablet Commonly known as:  ROBAXIN Take 1 tablet (500 mg total) by mouth 2 (two) times daily with a meal.   oxyCODONE-acetaminophen 5-325 MG tablet Commonly known as:  ROXICET Take 1 tablet by mouth every 6 (six) hours as needed for severe pain. What changed:  when to take this       Diagnostic Studies: Dg Chest 2 View  Result Date: 12/14/2015 CLINICAL DATA:  Chest pain, hemoptysis. EXAM: CHEST  2 VIEW COMPARISON:  Radiographs of December 09, 2015. FINDINGS: The heart size and mediastinal contours are within normal limits. Both lungs  are clear. No pneumothorax or pleural effusion is noted. The visualized skeletal structures are unremarkable. IMPRESSION: No active cardiopulmonary disease. Electronically Signed   By: Lupita Raider, M.D.   On: 12/14/2015 10:24   Dg Chest 2 View  Result Date: 12/09/2015 CLINICAL DATA:  Preop exam for hip replacement 12/12/2015. EXAM: CHEST  2 VIEW COMPARISON:  08/01/2013 FINDINGS: The heart size and mediastinal contours are within normal limits. Both lungs are clear. The visualized skeletal structures are unremarkable. IMPRESSION: No active cardiopulmonary disease. Electronically Signed   By: Elberta Fortis M.D.   On: 12/09/2015 16:40   Dg C-arm 1-60 Min  Result Date: 12/12/2015 CLINICAL DATA:  Right hip  arthroplasty. EXAM: OPERATIVE RIGHT HIP (WITH PELVIS IF PERFORMED) 2 VIEWS TECHNIQUE: Fluoroscopic spot image(s) were submitted for interpretation post-operatively. COMPARISON:  11/16/2015. FINDINGS: Total right hip replacement. Hardware intact. Good anatomic alignment. No acute bony abnormality . Two views. 0 minutes 23 seconds fluoroscopy time. IMPRESSION: Total right hip replacement. Electronically Signed   By: Maisie Fus  Register   On: 12/12/2015 16:42   Dg Hip Operative Unilat With Pelvis Right  Result Date: 12/12/2015 CLINICAL DATA:  Right hip arthroplasty. EXAM: OPERATIVE RIGHT HIP (WITH PELVIS IF PERFORMED) 2 VIEWS TECHNIQUE: Fluoroscopic spot image(s) were submitted for interpretation post-operatively. COMPARISON:  11/16/2015. FINDINGS: Total right hip replacement. Hardware intact. Good anatomic alignment. No acute bony abnormality . Two views. 0 minutes 23 seconds fluoroscopy time. IMPRESSION: Total right hip replacement. Electronically Signed   By: Maisie Fus  Register   On: 12/12/2015 16:42   Dg Hip Unilat W Or W/o Pelvis 2-3 Views Right  Result Date: 11/16/2015 CLINICAL DATA:  Right hip pain and popping. Tender over the anterior thigh EXAM: DG HIP (WITH OR WITHOUT PELVIS) 2-3V RIGHT COMPARISON:  None. FINDINGS: The bony pelvis is subjectively adequately mineralized. There is no lytic nor blastic lesion. AP and lateral views of the right hip reveal preservation of the joint space. The articular surfaces of the femoral head and acetabulum remains smoothly rounded. There is scleroses within portions of the femoral head. The femoral neck, intertrochanteric, and subtrochanteric regions are normal. IMPRESSION: 1. No acute fracture or dislocation of the right hip is observed. 2. Scleroses within the right femoral head not clearly related to the articular surface. This could reflect avascular necrosis in the appropriate clinical setting. This was not visible on the CT images of the hips of May 20, 2007.  MRI of the right hip would be a useful next imaging step. Electronically Signed   By: David  [place] M.D.   On: 11/16/2015 15:39    Disposition: 01-Home or Self Care  Discharge Instructions    Call MD / Call 911    Complete by:  As directed    If you experience chest pain or shortness of breath, CALL 911 and be transported to the hospital emergency room.  If you develope a fever above 101 F, pus (white drainage) or increased drainage or redness at the wound, or calf pain, call your surgeon's office.   Constipation Prevention    Complete by:  As directed    Drink plenty of fluids.  Prune juice may be helpful.  You may use a stool softener, such as Colace (over the counter) 100 mg twice a day.  Use MiraLax (over the counter) for constipation as needed.   Diet - low sodium heart healthy    Complete by:  As directed    Driving restrictions    Complete by:  As  directed    No driving for 2 weeks   Follow the hip precautions as taught in Physical Therapy    Complete by:  As directed    Increase activity slowly as tolerated    Complete by:  As directed    Patient may shower    Complete by:  As directed    You may shower without a dressing once there is no drainage.  Do not wash over the wound.  If drainage remains, cover wound with plastic wrap and then shower.      Follow-up Information    Nestor Lewandowsky, MD Follow up in 2 week(s).   Specialty:  Orthopedic Surgery Contact information: [address] [place] [address] [phone]        [facility] .   Why:  Someone from [facility] will contact you to arrange start date and time for therapy. Contact information: [address] [place] [address] [phone]            Signed: Vear Clock, Elisandra Deshmukh R 12/15/2015, 1:22 PM

## 2015-12-15 NOTE — Progress Notes (Signed)
Physical Therapy Treatment Patient Details Name: Carl Small MRN: [id number] DOB: 11-22-1975 Today's Date: 12/15/2015    History of Present Illness 40 y.o. male now s/p Rt direct anterior THA on 12/12/15. PMH: HTN.     PT Comments    Patient is making gradual progress with PT, ambulating 160 ft with rw. Pt reports feeling confident with his current mobility and declines attempting stairs or HEP at this time. From a mobility standpoint anticipate patient will be ready for DC home once released.     Follow Up Recommendations  Home health PT;Supervision for mobility/OOB     Equipment Recommendations  Rolling walker with 5" wheels    Recommendations for Other Services       Precautions / Restrictions Precautions Precautions: Fall Precaution Comments: reviewed WBAT status with pt Restrictions Weight Bearing Restrictions: Yes RLE Weight Bearing: Weight bearing as tolerated    Mobility  Bed Mobility Overal bed mobility: Independent             General bed mobility comments: supine to/from sitting  Transfers Overall transfer level: Needs assistance Equipment used: None Transfers: Sit to/from Stand Sit to Stand: Supervision         General transfer comment: pt demonstrating safe technique.   Ambulation/Gait Ambulation/Gait assistance: Supervision Ambulation Distance (Feet): 160 Feet Assistive device: Rolling walker (2 wheeled) Gait Pattern/deviations: Step-through pattern;Decreased weight shift to right;Trunk flexed Gait velocity: decreased   General Gait Details: Cues for posture provided as well as cues for weightbearing through Rt LE.    Stairs         General stair comments: Pt declined attempting stairs, states that he feels confident with his ability to do them to enter his home.   Wheelchair Mobility    Modified Rankin (Stroke Patients Only)       Balance Overall balance assessment: Needs assistance Sitting-balance support: No upper  extremity supported Sitting balance-Leahy Scale: Good     Standing balance support: During functional activity Standing balance-Leahy Scale: Fair Standing balance comment: using rw for ambulation                    Cognition Arousal/Alertness: Awake/alert Behavior During Therapy: WFL for tasks assessed/performed Overall Cognitive Status: Within Functional Limits for tasks assessed                      Exercises      General Comments General comments (skin integrity, edema, etc.): Pt declined performing HEP, states that he understands them and does not need to review them.       Pertinent Vitals/Pain Pain Assessment: 0-10 Pain Score: 6  Pain Location: Rt hip Pain Descriptors / Indicators: Sore Pain Intervention(s): Limited activity within patient's tolerance;Monitored during session    Home Living                      Prior Function            PT Goals (current goals can now be found in the care plan section) Acute Rehab PT Goals Patient Stated Goal: Get home.  PT Goal Formulation: With patient Time For Goal Achievement: 12/27/15 Potential to Achieve Goals: Fair Progress towards PT goals: Progressing toward goals    Frequency    7X/week      PT Plan Current plan remains appropriate    Co-evaluation             End of Session   Activity Tolerance: Patient  tolerated treatment well Patient left: in bed;with call bell/phone within reach     Time: 1123-1134 PT Time Calculation (min) (ACUTE ONLY): 11 min  Charges:  $Gait Training: 8-22 mins                    G Codes:      Carl Small, PT, CSCS Pager [phone] Office [phone]  12/15/2015, 11:40 AM

## 2015-12-15 NOTE — Progress Notes (Signed)
Patient discharged to home, discharge instructions given, patient stated he understood, prescriptions given, Dr Edward JollySilva stated he called reglan into patients pharmacy

## 2015-12-15 NOTE — Consult Note (Signed)
Medical Consultation   Carl Small  ZOX:[id number]  DOB: 1976-03-01  DOA: 12/12/2015  PCP: Magdalene River, PA-C   Outpatient Specialists: Gay[facility] Shih   Requesting physician: Minerva Areola - [facility]  Reason for consultation: Hematemesis   History of Present Illness: Carl Small is an 40 y.o. male w/ h/o HTN testicular torsion and AVN R hip, s/p R total hip arthroplasty POD #2. Consulted by primary team after pt hematemesis x2 day. Pt reports spitting up "potting soil" type materlia the past 2 days. Associated with increased reflux and esophagitis. Of note patient has this at baseline but this is worse than normal. Patient denies any chronic NSAID use. Patient has been on aspirin 325 since surgery. Patient typically takes Maalox home with improvement. Patient denies any malonic stool, hematochezia, bright red blood per rectum, large volume hematemesis, lightheadedness, palpitations, cardiac type chest pain, shortness of breath, fevers, productive cough. Patient does endorse occasional nonproductive cough which is associated with his heartburn.  Subjective:   Patient seen and examined at with family bedside. Patient continues to c/o hiccups, however acid reflux has resolved. Denies nauseas, vomiting and diarrhea. Patient wishes to go home. No other concerns at this point.   Review of Systems:  ROS As per HPI otherwise 10 point review of systems negative.    Past Medical History: Past Medical History:  Diagnosis Date  . Hypertension   . Torsion of testicle     Past Surgical History: Past Surgical History:  Procedure Laterality Date  . FRACTURE SURGERY Right   . TESTICLE SURGERY  1996  . TOTAL HIP ARTHROPLASTY Right 12/12/2015   Procedure: TOTAL HIP ARTHROPLASTY ANTERIOR APPROACH;  Surgeon: Gean Birchwood, MD;  Location: [facility] OR;  Service: Orthopedics;  Laterality: Right;     Allergies:   Allergies  Allergen Reactions  . Penicillins Swelling    SWELLING  REACTION UNSPECIFIED  Has patient had a PCN reaction causing immediate rash, facial/tongue/throat swelling, SOB or lightheadedness with hypotension: Yes Has patient had a PCN reaction causing severe rash involving mucus membranes or skin necrosis: No Has patient had a PCN reaction that required hospitalization No Has patient had a PCN reaction occurring within the last 10 years: No If all of the above answers are "NO", then may proceed with Cephalosporin use.   . Sulfa Antibiotics Hives  . Amoxicillin Other (See Comments)    UNSPECIFIED REACTION  NOTE: PT HAS SWELLING WITH IV PCN's     Social History:  reports that he has been smoking.  He has a 4.50 pack-year smoking history. He has never used smokeless tobacco. He reports that he drinks about 7.2 oz of alcohol per week . He reports that he does not use drugs.   Family History: Family History  Problem Relation Age of Onset  . Heart disease Mother   . Hypertension Mother   . Diabetes Mother   . Epilepsy Mother      Physical Exam: Vitals:   12/14/15 1958 12/15/15 0358 12/15/15 0808 12/15/15 1335  BP: (!) 154/92 (!) 148/78 (!) 152/88 123/74  Pulse: 98 100  90  Resp: 18 18  18   Temp: 98.6 F (37 C) 98.8 F (37.1 C)  98.5 F (36.9 C)  TempSrc: Oral Oral  Oral  SpO2: 100% 99%  100%  Weight:      Height:        General:  Appears calm and comfortable ENT:  grossly normal hearing, lips & tongue, mmm Neck:  no LAD, masses or thyromegaly Cardiovascular:  RRR, no m/r/g. No LE edema.  Respiratory:  CTA bilaterally, no w/r/r. Normal respiratory effort. Abdomen:  soft, ntnd, NABS Skin:  no rash or induration seen on limited exam Musculoskeletal: R hip w/ limited motion due to pain from recent surgery, no bony abnormality   Data reviewed:  I have personally reviewed following labs and imaging studies Labs:  CBC:  Recent Labs Lab 12/09/15 1426 12/13/15 0544 12/14/15 0348 12/14/15 0945 12/15/15 0430  WBC 6.3 15.0*  24.0* 25.5* 18.1*  NEUTROABS 3.3  --   --  21.4* 13.0*  HGB 17.0 14.6 14.4 14.8 13.6  HCT 51.1 44.4 44.2 44.7 42.0  MCV 89.3 89.7 90.9 91.6 90.9  PLT 200 184 215 222 221    Basic Metabolic Panel:  Recent Labs Lab 12/09/15 1426 12/14/15 0945  NA 138 136  K 3.9 3.4*  CL 106 96*  CO2 25 29  GLUCOSE 145* 115*  BUN 11 17  CREATININE 1.35* 1.43*  CALCIUM 9.5 9.2   GFR Estimated Creatinine Clearance: 91.8 mL/min (by C-G formula based on SCr of 1.43 mg/dL (H)). Liver Function Tests:  Recent Labs Lab 12/14/15 0945  AST 36  ALT 27  ALKPHOS 74  BILITOT 0.7  PROT 7.5  ALBUMIN 3.9   No results for input(s): LIPASE, AMYLASE in the last 168 hours. No results for input(s): AMMONIA in the last 168 hours. Coagulation profile  Recent Labs Lab 12/09/15 1426 12/14/15 0945  INR 0.96 1.00    Cardiac Enzymes: No results for input(s): CKTOTAL, CKMB, CKMBINDEX, TROPONINI in the last 168 hours. BNP: Invalid input(s): POCBNP CBG: No results for input(s): GLUCAP in the last 168 hours. D-Dimer No results for input(s): DDIMER in the last 72 hours. Hgb A1c No results for input(s): HGBA1C in the last 72 hours. Lipid Profile No results for input(s): CHOL, HDL, LDLCALC, TRIG, CHOLHDL, LDLDIRECT in the last 72 hours. Thyroid function studies No results for input(s): TSH, T4TOTAL, T3FREE, THYROIDAB in the last 72 hours.  Invalid input(s): FREET3 Anemia work up No results for input(s): VITAMINB12, FOLATE, FERRITIN, TIBC, IRON, RETICCTPCT in the last 72 hours. Urinalysis    Component Value Date/Time   COLORURINE YELLOW 12/14/2015 1907   APPEARANCEUR CLEAR 12/14/2015 1907   LABSPEC 1.020 12/14/2015 1907   PHURINE 6.0 12/14/2015 1907   GLUCOSEU NEGATIVE 12/14/2015 1907   HGBUR NEGATIVE 12/14/2015 1907   BILIRUBINUR NEGATIVE 12/14/2015 1907   KETONESUR NEGATIVE 12/14/2015 1907   PROTEINUR NEGATIVE 12/14/2015 1907   UROBILINOGEN 1.0 02/22/2014 1538   NITRITE NEGATIVE 12/14/2015  1907   LEUKOCYTESUR NEGATIVE 12/14/2015 1907     Microbiology Recent Results (from the past 240 hour(s))  Surgical pcr screen     Status: None   Collection Time: 12/09/15  2:38 PM  Result Value Ref Range Status   MRSA, PCR NEGATIVE NEGATIVE Final   Staphylococcus aureus NEGATIVE NEGATIVE Final    Comment:        The Xpert SA Assay (FDA approved for NASAL specimens in patients over 38 years of age), is one component of a comprehensive surveillance program.  Test performance has been validated by [facility] for patients greater than or equal to 381 year old. It is not intended to diagnose infection nor to guide or monitor treatment.        Inpatient Medications:   Scheduled Meds: . aspirin EC  325 mg Oral Q breakfast  .  docusate sodium  100 mg Oral BID  . hydrochlorothiazide  25 mg Oral Daily  . oxyCODONE  20 mg Oral Q12H  . pantoprazole  80 mg Oral Daily  . sucralfate  2 g Oral TID WC & HS   Continuous Infusions: . sodium chloride 75 mL/hr at 12/15/15 0514     Radiological Exams on Admission: Dg Chest 2 View  Result Date: 12/14/2015 CLINICAL DATA:  Chest pain, hemoptysis. EXAM: CHEST  2 VIEW COMPARISON:  Radiographs of December 09, 2015. FINDINGS: The heart size and mediastinal contours are within normal limits. Both lungs are clear. No pneumothorax or pleural effusion is noted. The visualized skeletal structures are unremarkable. IMPRESSION: No active cardiopulmonary disease. Electronically Signed   By: Lupita Raider, M.D.   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 POD #2.  - mgt per primary te  Hematemesis: likely secnodary to PUD. Hgb 14 - baseline. Chronic h/o GERD. No previous GI workup. Limited episodes during admission.  Hgb stable. Smoker and ETOH use. - Protonix, - Reglan for hiccups  - Outpt GI workup - On medical stand poin stable to be discharge   Leukocytosis: 24 on 9/20. AFVSS. Post op related vs hemoconcentration, trending down no shift No signs of infection, afebrile, normal rhythm   HTN: Resolved  - continue HCTZ  Thank you for this consultation. Will sign off  Latrelle Dodrill M.D. [facility] 12/15/2015, 3:12 PM

## 2015-12-17 ENCOUNTER — Encounter: Payer: Self-pay | Admitting: Physician Assistant

## 2016-01-04 ENCOUNTER — Encounter (HOSPITAL_BASED_OUTPATIENT_CLINIC_OR_DEPARTMENT_OTHER): Payer: Self-pay

## 2016-01-04 ENCOUNTER — Emergency Department (HOSPITAL_BASED_OUTPATIENT_CLINIC_OR_DEPARTMENT_OTHER)
Admission: EM | Admit: 2016-01-04 | Discharge: 2016-01-04 | Disposition: A | Discharge status: Home / Self Care | Payer: BC Managed Care – PPO | Attending: Emergency Medicine | Admitting: Emergency Medicine

## 2016-01-04 DIAGNOSIS — M87051 Idiopathic aseptic necrosis of right femur: Secondary | ICD-10-CM

## 2016-01-04 DIAGNOSIS — M79674 Pain in right toe(s): Secondary | ICD-10-CM

## 2016-01-04 DIAGNOSIS — M7989 Other specified soft tissue disorders: Secondary | ICD-10-CM | POA: Insufficient documentation

## 2016-01-04 DIAGNOSIS — Z7982 Long term (current) use of aspirin: Secondary | ICD-10-CM | POA: Diagnosis not present

## 2016-01-04 DIAGNOSIS — Z79899 Other long term (current) drug therapy: Secondary | ICD-10-CM | POA: Insufficient documentation

## 2016-01-04 DIAGNOSIS — F17200 Nicotine dependence, unspecified, uncomplicated: Secondary | ICD-10-CM | POA: Diagnosis not present

## 2016-01-04 DIAGNOSIS — I10 Essential (primary) hypertension: Secondary | ICD-10-CM | POA: Insufficient documentation

## 2016-01-04 MED ORDER — CEPHALEXIN 500 MG PO CAPS: 500.0000 mg | ORAL_CAPSULE | Freq: Four times a day (QID) | ORAL | 0 refills | Status: DC

## 2016-01-04 MED ORDER — BUPIVACAINE HCL 0.5 % IJ SOLN
INTRAMUSCULAR | Status: AC
  Filled 2016-01-04: qty 1

## 2016-01-04 MED ORDER — BUPIVACAINE HCL 0.5 % IJ SOLN
50.0000 mL | Freq: Once | INTRAMUSCULAR | Status: AC
  Administered 2016-01-04: 50 mL

## 2016-01-04 MED ORDER — OXYCODONE-ACETAMINOPHEN 5-325 MG PO TABS: 1.0000 | ORAL_TABLET | Freq: Three times a day (TID) | ORAL | 0 refills | Status: DC | PRN

## 2016-01-04 MED ORDER — CEPHALEXIN 250 MG PO CAPS
1000.0000 mg | ORAL_CAPSULE | Freq: Once | ORAL | Status: AC
  Administered 2016-01-04: 1000 mg via ORAL
  Filled 2016-01-04: qty 4

## 2016-01-04 NOTE — ED Triage Notes (Signed)
Worsening right pinky toe pain and swelling for the last week.  Toe is painful to the touch, quite swollen and red, denies injury or trauma.  Pt had a right hip replacement two weeks ago.

## 2016-01-04 NOTE — ED Notes (Signed)
Pt verbalizes understanding of d/c instructions and denies any further need at this time. 

## 2016-01-04 NOTE — ED Provider Notes (Addendum)
[facility] Provider Note: Carl Dell, MD, FACEP  CSN: [id number] MRN: [id number] ARRIVAL: 01/04/16 at 0212   CHIEF COMPLAINT  Toe Pain   HISTORY OF PRESENT ILLNESS  Carl Small is a 40 y.o. male who recently had right total knee arthroplasty. He has a history of pain and swelling in his right fifth toe in the past and is known to have an absent right fifth toe distal phalanx.  He is here with pain and swelling in his right fifth toe which is gradually worsened over the past week. The pain is severe and worse with palpation, movement or weightbearing. There is also associated redness. The pain is deep and sharp and radiates proximally.  He has a prescription refill awaiting him at the pharmacy but is out of pain medication at the moment.   Past Medical History:  Diagnosis Date  . Hypertension   . Torsion of testicle     Past Surgical History:  Procedure Laterality Date  . FRACTURE SURGERY Right   . TESTICLE SURGERY  1996  . TOTAL HIP ARTHROPLASTY Right 12/12/2015   Procedure: TOTAL HIP ARTHROPLASTY ANTERIOR APPROACH;  Surgeon: Gean Birchwood, MD;  Location: [facility];  Service: Orthopedics;  Laterality: Right;    Family History  Problem Relation Age of Onset  . Heart disease Mother   . Hypertension Mother   . Diabetes Mother   . Epilepsy Mother     Social History  Substance Use Topics  . Smoking status: Current Every Day Smoker    Packs/day: 0.25    Years: 18.00  . Smokeless tobacco: Never Used  . Alcohol use 7.2 oz/week    12 Standard drinks or equivalent per week     Comment: weekends    Prior to Admission medications   Medication Sig Start Date End Date Taking? Authorizing Provider  aspirin EC 325 MG tablet Take 1 tablet (325 mg total) by mouth 2 (two) times daily. 12/12/15   Allena Katz, PA-C  hydrochlorothiazide (HYDRODIURIL) 25 MG tablet Take 1 tablet (25 mg total) by mouth daily. 12/06/15   Magdalene River, PA-C  methocarbamol (ROBAXIN)  500 MG tablet Take 1 tablet (500 mg total) by mouth 2 (two) times daily with a meal. 12/12/15   Allena Katz, PA-C  metoCLOPramide (REGLAN) 5 MG tablet Take 1 tablet (5 mg total) by mouth every 8 (eight) hours as needed for nausea (if ondansetron (ZOFRAN) ineffective.). 12/15/15   Lenox Ponds, MD  oxyCODONE-acetaminophen (ROXICET) 5-325 MG tablet Take 1 tablet by mouth every 6 (six) hours as needed for severe pain. 12/12/15   Allena Katz, PA-C    Allergies Penicillins; Sulfa antibiotics; and Amoxicillin   REVIEW OF SYSTEMS  Negative except as noted here or in the History of Present Illness.   PHYSICAL EXAMINATION  Initial Vital Signs Blood pressure 141/92, pulse 89, temperature 98 F (36.7 C), temperature source Oral, resp. rate 20, height 6\' 2"  (1.88 m), weight 240 lb (108.9 kg), SpO2 99 %.  Examination General: Well-developed, well-nourished male in no acute distress; appearance consistent with age of record HENT: normocephalic; atraumatic Eyes: Normal appearance Neck: supple Heart: regular rate and rhythm Lungs: clear to auscultation bilaterally Abdomen: soft; nondistended Extremities: No deformity; full range of motion except right hip and right fifth toe; pulses normal; edema and exquisite tenderness of the right fifth toe without significant erythema or warmth Neurologic: Awake, alert and oriented; motor function intact in all extremities and symmetric; no facial  droop Skin: Warm and dry; onychomycosis of most toenails Psychiatric: Anxious   RESULTS  Summary of this visit's results, reviewed by myself:   EKG Interpretation  Date/Time:    Ventricular Rate:    PR Interval:    QRS Duration:   QT Interval:    QTC Calculation:   R Axis:     Text Interpretation:        Laboratory Studies: No results found for this or any previous visit (from the past 24 hour(s)). Imaging Studies: No results found.  ED COURSE  Nursing notes and initial vitals signs,  including pulse oximetry, reviewed.  Vitals:   01/04/16 0218 01/04/16 0221  BP: 141/92   Pulse: 89   Resp: 20   Temp: 98 F (36.7 C)   TempSrc: Oral   SpO2: 99%   Weight:  240 lb (108.9 kg)  Height:  6\' 2"  (1.88 m)    PROCEDURES   INCISION AND DRAINAGE Performed by: Paula LibraMOLPUS,Luther Newhouse L Consent: Verbal consent obtained. Risks and benefits: risks, benefits and alternatives were discussed Type: possible felon  Body area: Right fifth toe  Anesthesia: Digital block  Local anesthetic: Bupivacaine 0.5 % without epinephrine  Anesthetic total: 5 ml  Only partial anesthesia was obtained but allowed for a more complete examination of the toe. There were no areas of fluctuance, no paronychia was seen, the pad was swollen but not rigid with intact capillary refill and no evidence of necrosis. A stab incision was made into the pad of the toe and no purulent material was obtained making the diagnosis of a felon unlikely. We will treat as a cellulitis and refer to podiatry.   Patient tolerance: Patient tolerated the procedure well with no immediate complications.     ED DIAGNOSES     ICD-9-CM ICD-10-CM   1. Pain and swelling of toe of right foot 729.5 M79.674    729.81 M79.89        Paula LibraJohn Tosh Glaze, MD 01/04/16 0310    Paula LibraJohn Thatiana Renbarger, MD 01/04/16 [id number]

## 2016-01-17 ENCOUNTER — Other Ambulatory Visit: Payer: Self-pay | Admitting: Podiatry

## 2016-01-17 ENCOUNTER — Ambulatory Visit (HOSPITAL_BASED_OUTPATIENT_CLINIC_OR_DEPARTMENT_OTHER)
Admission: RE | Admit: 2016-01-17 | Discharge: 2016-01-17 | Disposition: A | Discharge status: Home / Self Care | Payer: BC Managed Care – PPO | Source: Ambulatory Visit | Attending: Podiatry | Admitting: Podiatry

## 2016-01-17 ENCOUNTER — Telehealth (INDEPENDENT_AMBULATORY_CARE_PROVIDER_SITE_OTHER): Payer: Self-pay | Admitting: Radiology

## 2016-01-17 ENCOUNTER — Encounter: Payer: Self-pay | Admitting: Podiatry

## 2016-01-17 ENCOUNTER — Ambulatory Visit (INDEPENDENT_AMBULATORY_CARE_PROVIDER_SITE_OTHER): Payer: BC Managed Care – PPO | Admitting: Podiatry

## 2016-01-17 ENCOUNTER — Telehealth: Payer: Self-pay | Admitting: *Deleted

## 2016-01-17 DIAGNOSIS — R52 Pain, unspecified: Secondary | ICD-10-CM

## 2016-01-17 DIAGNOSIS — L02619 Cutaneous abscess of unspecified foot: Secondary | ICD-10-CM

## 2016-01-17 DIAGNOSIS — M79671 Pain in right foot: Secondary | ICD-10-CM | POA: Diagnosis not present

## 2016-01-17 DIAGNOSIS — L03119 Cellulitis of unspecified part of limb: Principal | ICD-10-CM

## 2016-01-17 DIAGNOSIS — L03031 Cellulitis of right toe: Secondary | ICD-10-CM

## 2016-01-17 DIAGNOSIS — Z01812 Encounter for preprocedural laboratory examination: Secondary | ICD-10-CM

## 2016-01-17 DIAGNOSIS — L02611 Cutaneous abscess of right foot: Secondary | ICD-10-CM | POA: Diagnosis not present

## 2016-01-17 MED ORDER — CLINDAMYCIN HCL 300 MG PO CAPS: 300.0000 mg | ORAL_CAPSULE | Freq: Three times a day (TID) | ORAL | 2 refills | Status: DC

## 2016-01-17 MED ORDER — OXYCODONE-ACETAMINOPHEN 5-325 MG PO TABS: 1.0000 | ORAL_TABLET | Freq: Three times a day (TID) | ORAL | 0 refills | Status: DC | PRN

## 2016-01-17 MED ORDER — HYDROMORPHONE HCL 2 MG PO TABS: 2.0000 mg | ORAL_TABLET | ORAL | 0 refills | Status: DC | PRN

## 2016-01-17 NOTE — Telephone Encounter (Signed)
Pt states that the meds that he got this morning are not helping that he is actually seems worse that he is trying to work. He wants to know what else he can do or take for the pain.

## 2016-01-17 NOTE — Progress Notes (Signed)
   Subjective:    Patient ID: Carl Small, male    DOB: 06-26-1975, 40 y.o.   MRN: [id number]  HPI I had right hip surgery on 12-12-15 and my right foot has been swelling really ever since I had my hip surgery and painful and went to the ER on 01/04/16 and they did x-rays and my foot is turning colors and today has been the most painful and there has been no injury that I know of and my nail on my right 5th toe needs to be cut    Review of Systems  All other systems reviewed and are negative.      Objective:   Physical Exam        Assessment & Plan:

## 2016-01-17 NOTE — Addendum Note (Signed)
Addended by: Ovid CurdWAGONER, Addalynn Kumari R on: 01/17/2016 05:10 PM   Modules accepted: Orders

## 2016-01-17 NOTE — Telephone Encounter (Addendum)
-----   Message from Vivi BarrackMatthew R Wagoner, DPM sent at 01/17/2016  9:40 AM EDT ----- Can you order an MRI of the right foot to rule out abscess. He has cellulitis which is reoccurring to the right foot and previous I&D done by the ER. Not improving on antibiotics. Can we try to get this MRI done this week? 01/18/2016-[employer] approved right foot MRI with and without contrast, Order# [id number], valid 01/18/2016 to 02/15/2106. [facility] to [facility]. 01/30/2016-[facility] states pt presented for MRI and waited 30 minutes for his appt and left. I  Spoke with pt and ahe said they had him waiting a hour on Thursday and 45 minutes on Saturday and he has kids that need to be watched and he can't not have a precise schedule. I told pt to call the center again and explain this and maybe they could set him up on one of the less frequented times. Pt agreed.

## 2016-01-17 NOTE — Progress Notes (Addendum)
Subjective: 40 year old male presents the office today for concerns of right foot pain which is been ongoing over the last several weeks. He recently had a right hip replacement performed. He states that after he got out of the hospital inserted has increase in swelling the pain just right foot. Apparently emergency room on October 11 an incision and drainage was performed but no pus was expressed. He was discharged home with Keflex and felt better for couple days before it started to gets red and painful again. He states is very painful his difficulty putting weight on his foot. Denies any history of surgery to the right foot. Denies any systemic complaints such as fevers, chills, nausea, vomiting. No acute changes since last appointment, and no other complaints at this time.   Recent hip replacement 12/12/2015.  Objective: AAO x3, NAD DP/PT pulses palpable bilaterally, CRT less than 3 seconds Protective sensation intact with Simms Weinstein monofilament There is edema and erythema to the right fifth toe. The incision and drainage site appears to be healed. Mild hyperkeratotic tissue present in the sulcus of the fifth toe. Unable to identify any underlying ulceration or drainage. There is edema mild as well as mild erythema or increase in warmth to the dorsal aspect of the right foot and there is tenderness diffusely to the right foot. Majority tenderness appears to the fifth toe and the lateral aspect of the foot. He denies any recent injury or trauma. There is no open sore identified unable to express any drainage. There is no fluctuance or crepitus there is no malodor. No other areas of pinpoint bony tenderness or pain with vibratory sensation. MMT 5/5, ROM WNL. No edema, erythema, increase in warmth to bilateral lower extremities.  No open lesions or pre-ulcerative lesions.  No pain with calf compression, swelling, warmth, erythema  Assessment: Right foot cellulitis  Plan: -All treatment  options discussed with the patient including all alternatives, risks, complications.  -X-rays were obtained and reviewed there is chronic deformity . of the right fifth toe have is unchanged. No soft tissue emphysema present. -He is almost completely with Keflex. We'll changed to clindamycin.  -There is no areas of fluctuance or crepitus. Upon further incision and drainage site at this time. Will order an MRI given the longevity symptoms as well as recent hip replacement as I do not want infection to spread.  -Ordered CBC, ESR, CRP, uric acid -Surgical shoe dispensed  -Percocet for pain.  -Elevation.  -Follow-up in 1 week or sooner if any issues are to arise. There is any signs or symptoms of worsening infection to go immediately to the emergency room he verbalized understanding.  -Patient encouraged to call the office with any questions, concerns, change in symptoms.    ADDENDUM: patient called and states the percocet is not helping and he has been taking this medication for the last couple of weeks and it has not been helping. He has tried taking 2 pills and still having quite a bit of pain. Prescribed dilaudid 66m every 4 hours prn. He cannot work or drive while taking pain medication and this was discussed with him.   MCelesta Gentile DPM

## 2016-01-18 ENCOUNTER — Telehealth: Payer: Self-pay | Admitting: *Deleted

## 2016-01-18 LAB — URIC ACID: Uric Acid: 7.1 mg/dL (ref 3.7–8.6)

## 2016-01-18 NOTE — Telephone Encounter (Signed)
Patient called yesterday stating that he was in a lot of pain and per Dr Ardelle AntonWagoner wrote for Dilaudid 2 mg and stated to the patient top bring the RX that Dr Ardelle AntonWagoner had prescribed earlier in the day and the patient arrived [facility] 5:[facility] pm [facility] to get the new RX and today I put the pills in the sharps container and Royden PurlKathy M-CMA was my witness. [name]

## 2016-01-19 ENCOUNTER — Telehealth: Payer: Self-pay | Admitting: Podiatry

## 2016-01-19 ENCOUNTER — Ambulatory Visit (INDEPENDENT_AMBULATORY_CARE_PROVIDER_SITE_OTHER): Payer: BC Managed Care – PPO | Admitting: Podiatry

## 2016-01-19 ENCOUNTER — Encounter: Payer: Self-pay | Admitting: Podiatry

## 2016-01-19 VITALS — BP 122/89 | HR 71 | Temp 96.1°F

## 2016-01-19 DIAGNOSIS — L02611 Cutaneous abscess of right foot: Secondary | ICD-10-CM

## 2016-01-19 DIAGNOSIS — L97511 Non-pressure chronic ulcer of other part of right foot limited to breakdown of skin: Secondary | ICD-10-CM | POA: Diagnosis not present

## 2016-01-19 DIAGNOSIS — M79671 Pain in right foot: Secondary | ICD-10-CM

## 2016-01-19 DIAGNOSIS — L03031 Cellulitis of right toe: Secondary | ICD-10-CM

## 2016-01-19 MED ORDER — CLINDAMYCIN HCL 300 MG PO CAPS: 600.0000 mg | ORAL_CAPSULE | Freq: Three times a day (TID) | ORAL | 1 refills | Status: DC

## 2016-01-19 NOTE — Telephone Encounter (Signed)
OK- have him see Dr. Logan BoresEvans today

## 2016-01-19 NOTE — Telephone Encounter (Signed)
Pt called at 1227pm and stated the discoloration in his foot looks worse. He has been on antibiotics so much he thinks he may need stronger dose.  Per val get pt in, pt coming to see Dr Logan BoresEvans today at 315pm I did offer him tomorrow with Dr Ardelle AntonWagoner also.

## 2016-01-19 NOTE — Telephone Encounter (Signed)
Pt has an appt for 3:15pm today.

## 2016-01-20 ENCOUNTER — Other Ambulatory Visit: Payer: Self-pay

## 2016-01-20 ENCOUNTER — Other Ambulatory Visit: Payer: Self-pay | Admitting: Podiatry

## 2016-01-20 MED ORDER — DIAZEPAM 5 MG PO TABS: 5.0000 mg | ORAL_TABLET | Freq: Two times a day (BID) | ORAL | 0 refills | Status: DC | PRN

## 2016-01-20 NOTE — Telephone Encounter (Signed)
Pt called the wrong office this is not our patient and he is being treated by Dr. Gabriel Rungwagoner's office.

## 2016-01-20 NOTE — Telephone Encounter (Signed)
Spoke with pt informing him that the not all the lab work was obtained by [facility] and that he needed to go back to the lab and have the blood work redrawn. He also inquired about the castellani paint was unavailable OTC. Advised him that he could pick [facility] a bottle at the [facility]. Orders were faxed to [facility] in [place] [address]

## 2016-01-20 NOTE — Telephone Encounter (Signed)
I am not sure what is needed from us at this point on this one, it was sent to me earlier.  Pls review, thanks.

## 2016-01-20 NOTE — Telephone Encounter (Signed)
This pt called the wrong office.

## 2016-01-21 LAB — CBC WITH DIFFERENTIAL/PLATELET
BASOS ABS: 0 10*3/uL (ref 0.0–0.2)
Basos: 0 %
EOS (ABSOLUTE): 0.2 10*3/uL (ref 0.0–0.4)
Eos: 2 %
HEMOGLOBIN: 14.7 g/dL (ref 12.6–17.7)
Hematocrit: 43.4 % (ref 37.5–51.0)
Immature Grans (Abs): 0 10*3/uL (ref 0.0–0.1)
Immature Granulocytes: 0 %
LYMPHS ABS: 2 10*3/uL (ref 0.7–3.1)
Lymphs: 25 %
MCH: 29 pg (ref 26.6–33.0)
MCHC: 33.9 g/dL (ref 31.5–35.7)
MCV: 86 fL (ref 79–97)
MONOCYTES: 11 %
Monocytes Absolute: 0.9 10*3/uL (ref 0.1–0.9)
Neutrophils Absolute: 5 10*3/uL (ref 1.4–7.0)
Neutrophils: 62 %
PLATELETS: 273 10*3/uL (ref 150–379)
RBC: 5.07 x10E6/uL (ref 4.14–5.80)
RDW: 14.5 % (ref 12.3–15.4)
WBC: 8.1 10*3/uL (ref 3.4–10.8)

## 2016-01-21 LAB — SEDIMENTATION RATE: Sed Rate: 5 mm/hr (ref 0–15)

## 2016-01-24 ENCOUNTER — Ambulatory Visit: Payer: BC Managed Care – PPO | Admitting: Podiatry

## 2016-01-26 ENCOUNTER — Ambulatory Visit (HOSPITAL_BASED_OUTPATIENT_CLINIC_OR_DEPARTMENT_OTHER): Admission: RE | Admit: 2016-01-26 | Payer: BC Managed Care – PPO | Source: Ambulatory Visit

## 2016-01-26 LAB — C-REACTIVE PROTEIN: CRP: 27.7 mg/L — ABNORMAL HIGH (ref 0.0–4.9)

## 2016-01-26 LAB — SPECIMEN STATUS REPORT

## 2016-01-28 ENCOUNTER — Encounter (HOSPITAL_BASED_OUTPATIENT_CLINIC_OR_DEPARTMENT_OTHER): Payer: Self-pay

## 2016-01-28 ENCOUNTER — Ambulatory Visit (HOSPITAL_BASED_OUTPATIENT_CLINIC_OR_DEPARTMENT_OTHER): Admission: RE | Admit: 2016-01-28 | Payer: BC Managed Care – PPO | Source: Ambulatory Visit

## 2016-01-29 NOTE — Progress Notes (Signed)
Subjective:  Patient presents for pain and tenderness to the interdigital area fourth interspace right foot. Patient states the pains been ongoing for several weeks. Patient presents today for further treatment and evaluation.    Objective/Physical Exam General: The patient is alert and oriented x3 in no acute distress.  Dermatology: Open wound noted to the fourth interdigital area of the right foot between digits 4-5 measuring 0.30.30.2 cm. The wound is exposed down to subcutaneous tissue. No malodor, no purulent drainage, periwound integrity is macerated and callused.  Vascular: Palpable pedal pulses bilaterally. No edema or erythema noted. Capillary refill within normal limits.  Neurological: Epicritic and protective threshold grossly intact bilaterally.   Musculoskeletal Exam: Range of motion within normal limits to all pedal and ankle joints bilateral. Muscle strength 5/5 in all groups bilateral.   Radiographic Exam:  Normal osseous mineralization. Joint spaces preserved. No fracture/dislocation/boney destruction.    Assessment: #1 Heloma Molle fourth interdigital space right foot #2 open wound or the interdigital space right foot #3 pain in right foot   Plan of Care:  #1 Patient was evaluated. #2 excisional debridement of all necrotic tissue was performed to the open wound using a chisel blade and tissue nipper. Excisional debridement of all the necrotic nonviable tissue down to healthy bleeding viable tissue was performed with post-debridement measurements same as pre- #3 wound cultures were taken #4 Castellani paint applied #5 recommend daily application of Castellani paint #6 return to clinic in 2 weeks   Dr. Felecia ShellingBrent M. Evans, DPM [facility]

## 2016-01-31 ENCOUNTER — Ambulatory Visit (INDEPENDENT_AMBULATORY_CARE_PROVIDER_SITE_OTHER): Payer: BC Managed Care – PPO | Admitting: Podiatry

## 2016-01-31 ENCOUNTER — Encounter: Payer: Self-pay | Admitting: Podiatry

## 2016-01-31 DIAGNOSIS — L97511 Non-pressure chronic ulcer of other part of right foot limited to breakdown of skin: Secondary | ICD-10-CM

## 2016-01-31 DIAGNOSIS — L03031 Cellulitis of right toe: Secondary | ICD-10-CM

## 2016-01-31 DIAGNOSIS — L02611 Cutaneous abscess of right foot: Secondary | ICD-10-CM

## 2016-01-31 MED ORDER — HYDROMORPHONE HCL 2 MG PO TABS: 2.0000 mg | ORAL_TABLET | Freq: Four times a day (QID) | ORAL | 0 refills | Status: DC | PRN

## 2016-02-01 ENCOUNTER — Encounter: Payer: Self-pay | Admitting: Podiatry

## 2016-02-01 NOTE — Progress Notes (Signed)
Subjective: Patient presents the office they for follow-up evaluation of right foot infection. He saw Dr. Logan BoresEvans last appointment he had a wound which was cultured. He is continued on antibiotics. He did not get the MRI done last week and is scheduled due to scheduled complex. He states the foot is better although is still painful. He states that prior to the infection on the right foot that he is been treated for use have ongoing problems the right foot. He is also for refill pain medicine today. Denies any systemic complaints such as fevers, chills, nausea, vomiting. No acute changes since last appointment, and no other complaints at this time.   Objective: AAO x3, NAD DP/PT pulses palpable bilaterally, CRT less than 3 seconds Small amount of interdigital maceration right fourth interspace there is no open lesion identified. Hyperkeratotic lesion the sulcus of the fifth toe however upon debridement again there is no underlying ulceration, drainage or any signs of infection. There is continued tenderness the fifth toe and the lateral aspect of the foot. There is significantly improved edema there is no significant erythema or increase in warmth. No edema, erythema, increase in warmth to bilateral lower extremities.  No open lesions or pre-ulcerative lesions.  No pain with calf compression, swelling, warmth, erythema  Assessment: Resolving infection with continued pain right foot  Plan: -All treatment options discussed with the patient including all alternatives, risks, complications.  -Continue antibiotics. He states that he does not need a refill.  -Continue surgical shoe -Awaiting MRI results. -Pain medication refilled today.  -Follow-up after MRI or sooner if any issues are to arise. Continue to monitor his signs or symptoms of worsening infection go the ER should any occur call the office.  -Patient encouraged to call the office with any questions, concerns, change in symptoms.   Ovid CurdMatthew  Ramyah Pankowski, DPM

## 2016-02-02 NOTE — Telephone Encounter (Addendum)
-----   Message from Vivi BarrackMatthew R Wagoner, DPM sent at 01/31/2016  3:56 PM EST ----- Can you please see if we can reschedule his [facility] on [address]? He did not have a good experience in [facility] [place] [facility] [place] and did not get it done last weekend. 02/02/2016-Faxed orders to [facility]. Informed pt I had [facility]ed [facility], and they should call him later today, left GI appt line.

## 2016-02-04 ENCOUNTER — Ambulatory Visit (HOSPITAL_BASED_OUTPATIENT_CLINIC_OR_DEPARTMENT_OTHER): Payer: BC Managed Care – PPO

## 2016-02-14 ENCOUNTER — Other Ambulatory Visit: Payer: Self-pay | Admitting: Podiatry

## 2016-02-14 ENCOUNTER — Ambulatory Visit
Admission: RE | Admit: 2016-02-14 | Discharge: 2016-02-14 | Disposition: A | Discharge status: Home / Self Care | Payer: BC Managed Care – PPO | Source: Ambulatory Visit | Attending: Podiatry | Admitting: Podiatry

## 2016-02-14 DIAGNOSIS — L02619 Cutaneous abscess of unspecified foot: Secondary | ICD-10-CM

## 2016-02-14 DIAGNOSIS — L03119 Cellulitis of unspecified part of limb: Principal | ICD-10-CM

## 2016-02-21 ENCOUNTER — Encounter: Payer: Self-pay | Admitting: Podiatry

## 2016-02-21 ENCOUNTER — Ambulatory Visit (INDEPENDENT_AMBULATORY_CARE_PROVIDER_SITE_OTHER): Payer: BC Managed Care – PPO | Admitting: Podiatry

## 2016-02-21 DIAGNOSIS — L02611 Cutaneous abscess of right foot: Secondary | ICD-10-CM | POA: Diagnosis not present

## 2016-02-21 DIAGNOSIS — M79671 Pain in right foot: Secondary | ICD-10-CM | POA: Diagnosis not present

## 2016-02-21 DIAGNOSIS — L03031 Cellulitis of right toe: Secondary | ICD-10-CM

## 2016-02-21 NOTE — Progress Notes (Signed)
Subjective: 40 year old male presents the office today evaluation of right foot pain and swelling and to discuss MRI results. He states that since his last appointment he is going had 2 episodes of swelling to his foot. He feels that the infection is improved however he now has continued chronic foot pain. He states that he is having some the same pain to his right foot that he was having last year. He tried the orthotics for couple of months but they seem to make the foot worse. He has not been wearing them and he said no other treatment since last year for this. Denies any systemic complaints such as fevers, chills, nausea, vomiting. No acute changes since last appointment, and no other complaints at this time.   Objective: AAO x3, NAD DP/PT pulses palpable bilaterally, CRT less than 3 seconds On the right foot there is minimal hyperkeratotic tissue in the sulcus of the fifth toe. No overlying ulceration, drainage or any signs of infection to this area. There is no lunar hyperkeratotic tissue the interspace. There is minimal edema to the fifth toe there is no erythema or increase in warmth. There is significantly improved edema to the foot there is no other areas of edema, erythema or increased warmth. The majority of his symptoms today are localized to the arch of the foot as well as the inside aspect of his ankle which is been ongoing for several years. There is no specific area pinpoint bony tenderness there is no pain the vibratory sensation. No open lesions or pre-ulcerative lesions.  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  Plan: -All treatment options discussed with the patient including all alternatives, risks, complications.  -MRI results were discussed the patient. There is chronic changes of the distal phalanx of the fifth toe and there may be an epidermoid  inclusion cyst or glomus tumor. This is likely chronic and oblique this is contributing to his foot pain. -Infection appears to be improving. We will hold off on further antibiotics and serve off of antibiotics. -He is wearing a surgical shoe and continue this but can transition to regular shoe as tolerated. -Recommended him to bring his orthotics back and we will see [facility] fitting and possibly modify them. -Follow-up next week or sooner if needed. At this point is infection. Pain improving without focus more on the chronic foot pain now. -Patient encouraged to call the office with any questions, concerns, change in symptoms.   Carl CurdMatthew Sinjin Small, DPM

## 2016-02-28 ENCOUNTER — Ambulatory Visit (INDEPENDENT_AMBULATORY_CARE_PROVIDER_SITE_OTHER): Payer: BC Managed Care – PPO | Admitting: Podiatry

## 2016-02-28 ENCOUNTER — Other Ambulatory Visit: Payer: Self-pay | Admitting: Podiatry

## 2016-02-28 ENCOUNTER — Encounter: Payer: Self-pay | Admitting: Podiatry

## 2016-02-28 DIAGNOSIS — M2141 Flat foot [pes planus] (acquired), right foot: Secondary | ICD-10-CM

## 2016-02-28 DIAGNOSIS — M79671 Pain in right foot: Secondary | ICD-10-CM | POA: Diagnosis not present

## 2016-02-28 DIAGNOSIS — L03031 Cellulitis of right toe: Secondary | ICD-10-CM

## 2016-02-28 DIAGNOSIS — M2142 Flat foot [pes planus] (acquired), left foot: Secondary | ICD-10-CM

## 2016-02-28 DIAGNOSIS — R234 Changes in skin texture: Secondary | ICD-10-CM

## 2016-02-28 DIAGNOSIS — M722 Plantar fascial fibromatosis: Secondary | ICD-10-CM

## 2016-02-28 DIAGNOSIS — G8929 Other chronic pain: Secondary | ICD-10-CM

## 2016-02-28 DIAGNOSIS — L02611 Cutaneous abscess of right foot: Secondary | ICD-10-CM | POA: Diagnosis not present

## 2016-02-28 MED ORDER — OXYCODONE-ACETAMINOPHEN 10-325 MG PO TABS: 1.0000 | ORAL_TABLET | Freq: Three times a day (TID) | ORAL | 0 refills | Status: DC | PRN

## 2016-02-28 MED ORDER — DOXYCYCLINE HYCLATE 100 MG PO TABS: 100.0000 mg | ORAL_TABLET | Freq: Two times a day (BID) | ORAL | 0 refills | Status: DC

## 2016-02-28 NOTE — Progress Notes (Signed)
Subjective: 40 year old male presents the office today evaluation of right foot pain. He states that over the weekend the right foot fifth toe is started become swollen and discolored again. He has noticed a crack in the bottom of the toe on the bottom. Denies any drainage or pus. He says and swelling to the toe as well. He states the color is not been a significant as what it was previously however he has had some swelling to the foot. Denies any recent injury or trauma.  He also presents today to further discuss his custom orthotics that he had made last year. He's been having chronic foot pain for quite some time without any significant resolution. He presents today to also discussed this.  Denies any systemic complaints such as fevers, chills, nausea, vomiting. No acute changes since last appointment, and no other complaints at this time.   Objective: AAO x3, NAD DP/PT pulses palpable bilaterally, CRT less than 3 seconds In the sulcus of the fifth toenail right foot is a macerated area with a horizontal fissure in the skin. There is no drainage or pus. There is localized edema to the fifth toe as well as the fourth MPJ dorsally. There is no significant erythema or increase in warmth. There is no ascending synovitis. There is no fluctuation orcrepitus not or malodor. There are no other open lesions or pre-ulcerative lesions. There is a significant decrease in medial arch height. Ankle, subtalar joint range of motion intact. There is no other areas of tenderness bilaterally. No open lesions or pre-ulcerative lesions.  No pain with calf compression, swelling, warmth, erythema  Assessment: reoccurrenceof infection right fifth toe. Chronic foot pain  Plan: -All treatment options discussed with the patient including all alternatives, risks, complications.  -Castellani's paint was applied to the sulcus of the fifth toe. Recommended continued use at home and yard he has his medication. We will start  doxycycline due to increase in swelling and pain and fissuring for infection. Monitor for signs or symptoms of worsening to the ER should any occur. Continue surgical shoe. Due to the pain level prescribed Percocet. -His orthotics are not fitting his foot. I did take pictures the inserts as well as new measurements of his feet and these were sent back to [facility] to remake the insert. We'll also try different style as well as see if this helps more. Discussed shoe gear changes well once he is able to go back into regular shoe -Follow-up in 2 weeks or sooner if needed.  Ovid CurdMatthew Wagoner, DPM

## 2016-02-29 LAB — HGB A1C W/O EAG: Hgb A1c MFr Bld: 5.2 % (ref 4.8–5.6)

## 2016-02-29 LAB — CBC WITH DIFFERENTIAL/PLATELET
Basophils Absolute: 0 10*3/uL (ref 0.0–0.2)
Basos: 0 %
EOS (ABSOLUTE): 0.3 10*3/uL (ref 0.0–0.4)
EOS: 3 %
HEMATOCRIT: 46.9 % (ref 37.5–51.0)
Hemoglobin: 15.9 g/dL (ref 13.0–17.7)
IMMATURE GRANULOCYTES: 0 %
Immature Grans (Abs): 0 10*3/uL (ref 0.0–0.1)
LYMPHS ABS: 2.2 10*3/uL (ref 0.7–3.1)
Lymphs: 23 %
MCH: 29.2 pg (ref 26.6–33.0)
MCHC: 33.9 g/dL (ref 31.5–35.7)
MCV: 86 fL (ref 79–97)
MONOS ABS: 1.2 10*3/uL — AB (ref 0.1–0.9)
Monocytes: 13 %
NEUTROS PCT: 61 %
Neutrophils Absolute: 5.6 10*3/uL (ref 1.4–7.0)
PLATELETS: 254 10*3/uL (ref 150–379)
RBC: 5.45 x10E6/uL (ref 4.14–5.80)
RDW: 15.3 % (ref 12.3–15.4)
WBC: 9.3 10*3/uL (ref 3.4–10.8)

## 2016-02-29 LAB — C-REACTIVE PROTEIN: CRP: 12.4 mg/L — AB (ref 0.0–4.9)

## 2016-02-29 LAB — SEDIMENTATION RATE: SED RATE: 5 mm/h (ref 0–15)

## 2016-03-13 ENCOUNTER — Encounter: Payer: Self-pay | Admitting: Podiatry

## 2016-03-13 ENCOUNTER — Ambulatory Visit (INDEPENDENT_AMBULATORY_CARE_PROVIDER_SITE_OTHER): Payer: BC Managed Care – PPO | Admitting: Podiatry

## 2016-03-13 VITALS — Temp 98.1°F

## 2016-03-13 DIAGNOSIS — G8929 Other chronic pain: Secondary | ICD-10-CM

## 2016-03-13 DIAGNOSIS — L02611 Cutaneous abscess of right foot: Secondary | ICD-10-CM | POA: Diagnosis not present

## 2016-03-13 DIAGNOSIS — L03031 Cellulitis of right toe: Secondary | ICD-10-CM

## 2016-03-13 DIAGNOSIS — M79671 Pain in right foot: Secondary | ICD-10-CM | POA: Diagnosis not present

## 2016-03-13 DIAGNOSIS — R234 Changes in skin texture: Secondary | ICD-10-CM

## 2016-03-15 ENCOUNTER — Telehealth: Payer: Self-pay | Admitting: *Deleted

## 2016-03-15 DIAGNOSIS — L03031 Cellulitis of right toe: Secondary | ICD-10-CM

## 2016-03-15 DIAGNOSIS — L02611 Cutaneous abscess of right foot: Secondary | ICD-10-CM

## 2016-03-15 NOTE — Telephone Encounter (Addendum)
-----   Message from Vivi BarrackMatthew R Wagoner, DPM sent at 03/14/2016 12:57 PM EST ----- Can you put in for a Infectious Disease consult with Dr. Trisha Mangleriplett in [place]. Carl Small had a right hip replacement and while in the hospital started to have redness and swelling to his right foot. I saw him and it looked pretty cellulitis. He has been on and off antibiotics and he keeps reporting that his foot gets infected. Just interesting the timing after hip replacement. I just wanted to get ID to take a look at him too. Thanks. 03/15/2016-Referral, clinicals and demographics faxed to [facility].

## 2016-03-16 NOTE — Progress Notes (Signed)
Subjective: 40 year old male presents the office today evaluation of right foot pain. He is on this completed the course of antibiotics. He states on Sunday he will And the foot was again swollen and red and painful. This did resolve. He states that the majority symptoms continue or right fifth toe.  He has a history of a recent right hip replacement. While he is in the hospital he notices right foot was swollen and red and painful. Upon discharge resolving the office in the foot appeared to be cellulitic. He started antibiotics. He is apparently getting reoccurring what appears to be infections to the right foot which also when he was in the hospital. He was found to have an infected corn between his toes previously.  History previous a treated for generalized symptomatic flatfeet. He still has that pain is well but the majority of concern today is further recurrent infections the right foot.  He currently denies any redness, warmth or any drainage or any open sores.  Objective: AAO x3, NAD DP/PT pulses palpable bilaterally, CRT less than 3 seconds Penicillins the fifth toe is a mild amount of hyperkeratotic tissue. Upon removal there is no underlying ulceration, drainage or any signs of infection. There is trace edema to the toe how there is no erythema increase in warmth. There is some mild swelling to the right foot as well but there is no associated redness or warmth. Aside from the fifth toe there is no other areas of tenderness as well. There is no other open lesions or pre-ulcerative lesions.  No pain with calf compression, swelling, warmth, erythema  Assessment: Reoccurrence of infection right fifth toe. Chronic foot pain  Plan: -All treatment options discussed with the patient including all alternatives, risks, complications.  -Hyperkeratotic tissue is debrided to the right fifth toe and the sulcus there is no underlying ulceration. Recommend complete a course of antibiotic. Given that  he has had Tendon reoccurring infections this right foot which started after a hip replacement he states on have the patient follow-up with infectious disease as well. -Continue surgical shoe for now. -Continue with His Pain to the Interspace As Discussed [place]. -Also again discussed with him the MRI findings. There is cyst of the distal aspect of the toe and there is chronic deformity to the bone and the fifth toe. This has been ongoing for some time and this all started before he started having symptoms to the fifth toe. I do not think that by trying to remove the cyst this fifth toes were limited his pain as his pain does not appear to be localized directly overlying this area alone. -Monitor for any signs or symptoms of worsening infection of the ERs any occur. -He is awaiting orthotics to come back. -Follow-up as scheduled or sooner if needed. Call any questions or concerns any change in symptoms.  Ovid CurdMatthew Keyairra Kolinski, DPM

## 2016-05-15 ENCOUNTER — Telehealth: Payer: Self-pay | Admitting: *Deleted

## 2016-05-15 NOTE — Telephone Encounter (Signed)
Called and left a message stating that the patient can come by the [facility] to pick up the inserts that were re-done. [name]

## 2016-05-29 ENCOUNTER — Telehealth: Payer: Self-pay | Admitting: *Deleted

## 2016-05-29 NOTE — Telephone Encounter (Signed)
Called patient and left a message that [facility] are in [facility] and can come by and pick them up. [name]

## 2016-06-06 ENCOUNTER — Encounter (HOSPITAL_BASED_OUTPATIENT_CLINIC_OR_DEPARTMENT_OTHER): Payer: Self-pay | Admitting: Emergency Medicine

## 2016-06-06 ENCOUNTER — Emergency Department (HOSPITAL_BASED_OUTPATIENT_CLINIC_OR_DEPARTMENT_OTHER)
Admission: EM | Admit: 2016-06-06 | Discharge: 2016-06-06 | Disposition: A | Discharge status: Home / Self Care | Payer: BC Managed Care – PPO | Attending: Emergency Medicine | Admitting: Emergency Medicine

## 2016-06-06 DIAGNOSIS — I10 Essential (primary) hypertension: Secondary | ICD-10-CM | POA: Diagnosis not present

## 2016-06-06 DIAGNOSIS — J069 Acute upper respiratory infection, unspecified: Secondary | ICD-10-CM | POA: Diagnosis not present

## 2016-06-06 DIAGNOSIS — Z79899 Other long term (current) drug therapy: Secondary | ICD-10-CM | POA: Insufficient documentation

## 2016-06-06 DIAGNOSIS — F17200 Nicotine dependence, unspecified, uncomplicated: Secondary | ICD-10-CM | POA: Insufficient documentation

## 2016-06-06 DIAGNOSIS — R748 Abnormal levels of other serum enzymes: Secondary | ICD-10-CM | POA: Diagnosis not present

## 2016-06-06 DIAGNOSIS — R112 Nausea with vomiting, unspecified: Secondary | ICD-10-CM | POA: Diagnosis not present

## 2016-06-06 DIAGNOSIS — J029 Acute pharyngitis, unspecified: Secondary | ICD-10-CM | POA: Diagnosis present

## 2016-06-06 LAB — LIPASE, BLOOD: Lipase: 25 U/L (ref 11–51)

## 2016-06-06 LAB — CBC WITH DIFFERENTIAL/PLATELET
BASOS ABS: 0 10*3/uL (ref 0.0–0.1)
BASOS PCT: 0 %
EOS ABS: 0 10*3/uL (ref 0.0–0.7)
Eosinophils Relative: 0 %
HEMATOCRIT: 45.9 % (ref 39.0–52.0)
HEMOGLOBIN: 15.6 g/dL (ref 13.0–17.0)
Lymphocytes Relative: 12 %
Lymphs Abs: 1.6 10*3/uL (ref 0.7–4.0)
MCH: 28.9 pg (ref 26.0–34.0)
MCHC: 34 g/dL (ref 30.0–36.0)
MCV: 85 fL (ref 78.0–100.0)
Monocytes Absolute: 1.3 10*3/uL — ABNORMAL HIGH (ref 0.1–1.0)
Monocytes Relative: 10 %
NEUTROS PCT: 78 %
Neutro Abs: 10.2 10*3/uL — ABNORMAL HIGH (ref 1.7–7.7)
Platelets: 197 10*3/uL (ref 150–400)
RBC: 5.4 MIL/uL (ref 4.22–5.81)
RDW: 15.1 % (ref 11.5–15.5)
WBC: 13.1 10*3/uL — AB (ref 4.0–10.5)

## 2016-06-06 LAB — COMPREHENSIVE METABOLIC PANEL
ALK PHOS: 88 U/L (ref 38–126)
ALT: 30 U/L (ref 17–63)
AST: 27 U/L (ref 15–41)
Albumin: 4 g/dL (ref 3.5–5.0)
Anion gap: 5 (ref 5–15)
BILIRUBIN TOTAL: 0.5 mg/dL (ref 0.3–1.2)
BUN: 15 mg/dL (ref 6–20)
CALCIUM: 9 mg/dL (ref 8.9–10.3)
CO2: 25 mmol/L (ref 22–32)
Chloride: 106 mmol/L (ref 101–111)
Creatinine, Ser: 1.41 mg/dL — ABNORMAL HIGH (ref 0.61–1.24)
GFR calc Af Amer: >60 mL/min (ref >60)
GFR calc non Af Amer: >60 mL/min (ref >60)
GLUCOSE: 167 mg/dL — AB (ref 65–99)
Potassium: 3.8 mmol/L (ref 3.5–5.1)
SODIUM: 136 mmol/L (ref 135–145)
TOTAL PROTEIN: 7.1 g/dL (ref 6.5–8.1)

## 2016-06-06 LAB — RAPID STREP SCREEN (MED CTR MEBANE ONLY): STREPTOCOCCUS, GROUP A SCREEN (DIRECT): NEGATIVE

## 2016-06-06 MED ORDER — KETOROLAC TROMETHAMINE 60 MG/2ML IM SOLN
30.0000 mg | Freq: Once | INTRAMUSCULAR | Status: AC
  Administered 2016-06-06: 30 mg via INTRAMUSCULAR
  Filled 2016-06-06: qty 2

## 2016-06-06 MED ORDER — ONDANSETRON 8 MG PO TBDP: 8.0000 mg | ORAL_TABLET | Freq: Three times a day (TID) | ORAL | 0 refills | Status: DC | PRN

## 2016-06-06 MED ORDER — ONDANSETRON 8 MG PO TBDP
8.0000 mg | ORAL_TABLET | Freq: Once | ORAL | Status: AC
  Administered 2016-06-06: 8 mg via ORAL
  Filled 2016-06-06: qty 1

## 2016-06-06 MED FILL — ONDANSETRON ODT 8 MG TABLET: 8 | 4 days supply | Qty: 12 | Fill #0

## 2016-06-06 NOTE — Discharge Instructions (Signed)
Take Tylenol for fever, chills, body aches, pain. Just complaining of fluids. Rest. Take Zofran as prescribed as needed for nausea and vomiting. Follow-up with family doctor regarding her slightly elevated blood sugar and kidney function tests.

## 2016-06-06 NOTE — ED Provider Notes (Signed)
[facility]   CSN: [id number] Arrival date & time: 06/06/16  7829     History   Chief Complaint Chief Complaint  Patient presents with  . Sore Throat    HPI Carl Small is a 41 y.o. male.  HPI Carl Small is a 41 y.o. male with history of hypertension, presents to emergency department complaining of sore throat, headache, body aches, nausea, vomiting. Patient states his symptoms started yesterday. He took DayQuil this morning for his symptoms but felt worse when he got to work. He states he has had several episodes of vomiting. Denies diarrhea. Last bowel movement yesterday and normal. Denies urinary symptoms. States pain and his throat is severe, states it's very painful to swallow. He has been gargling salt water and using Chloraseptic spray with no relief. He reports mild cough with clear productive sputum. He denies any nasal congestion. No pain in his ears. No neck pain or stiffness. No fever or chills. Reports diffuse abdominal discomfort and nausea.  Past Medical History:  Diagnosis Date  . Hypertension   . Torsion of testicle     Patient Active Problem List   Diagnosis Date Noted  . Hematemesis 12/14/2015  . Leukocytosis 12/14/2015  . Avascular necrosis of bone of hip (HCC) 12/12/2015  . Essential hypertension 12/06/2015  . 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 Osteoarthritis, hip, bilateral 12/02/2015    Past Surgical History:  Procedure Laterality Date  . FRACTURE SURGERY Right   . TESTICLE SURGERY  1996  . TOTAL HIP ARTHROPLASTY Right 12/12/2015   Procedure: TOTAL HIP ARTHROPLASTY ANTERIOR APPROACH;  Surgeon: Gean Birchwood, MD;  Location: [facility] OR;  Service: Orthopedics;  Laterality: Right;       Home Medications    Prior to Admission medications   Medication Sig Start Date End Date Taking? Authorizing Provider  hydrochlorothiazide (HYDRODIURIL) 25 MG tablet Take 1 tablet (25 mg total) by mouth daily.  12/06/15  Yes Magdalene River, PA-C  diazepam (VALIUM) 5 MG tablet Take 1 tablet (5 mg total) by mouth every 12 (twelve) hours as needed for anxiety. 01/20/16   Vivi Barrack, DPM    Family History Family History  Problem Relation Age of Onset  . Heart disease Mother   . Hypertension Mother   . Diabetes Mother   . Epilepsy Mother     Social History Social History  Substance Use Topics  . Smoking status: Current Every Day Smoker    Packs/day: 0.25    Years: 18.00  . Smokeless tobacco: Never Used  . Alcohol use 7.2 oz/week    12 Standard drinks or equivalent per week     Comment: weekends     Allergies   Penicillins; Sulfa antibiotics; and Amoxicillin   Review of Systems Review of Systems  Constitutional: Positive for fatigue. Negative for chills and fever.  HENT: Positive for sore throat. Negative for congestion.   Respiratory: Positive for cough. Negative for chest tightness and shortness of breath.   Cardiovascular: Negative for chest pain, palpitations and leg swelling.  Gastrointestinal: Positive for abdominal pain, nausea and vomiting. Negative for abdominal distention and diarrhea.  Genitourinary: Negative for dysuria, frequency, hematuria and urgency.  Musculoskeletal: Positive for myalgias. Negative for arthralgias, neck pain and neck stiffness.  Skin: Negative for rash.  Allergic/Immunologic: Negative for immunocompromised state.  Neurological: Positive for headaches. Negative for dizziness, weakness, light-headedness and numbness.     Physical Exam Updated Vital Signs BP 155/99  Pulse 100   Temp 98.2 F (36.8 C)   Resp 16   Ht 6\' 4"  (1.93 m)   Wt 111.1 kg   SpO2 96%   BMI 29.82 kg/m   Physical Exam  Constitutional: He appears well-developed and well-nourished. No distress.  HENT:  Head: Normocephalic and atraumatic.  Right Ear: External ear normal.  Left Ear: External ear normal.  Nose: Nose normal.  Oropharynx erythematous. Uvula  midline. No exudate.  Eyes: Conjunctivae are normal.  Neck: Normal range of motion. Neck supple.  Cardiovascular: Normal rate, regular rhythm and normal heart sounds.   Pulmonary/Chest: Effort normal. No respiratory distress. He has no wheezes. He has no rales.  Abdominal: Soft. Bowel sounds are normal. He exhibits no distension. There is tenderness. There is no rebound.  Diffuse tenderness, no guarding.  Musculoskeletal: He exhibits no edema.  Lymphadenopathy:    He has no cervical adenopathy.  Neurological: He is alert.  Skin: Skin is warm and dry.  Nursing note and vitals reviewed.    ED Treatments / Results  Labs (all labs ordered are listed, but only abnormal results are displayed) Labs Reviewed  CBC WITH DIFFERENTIAL/PLATELET - Abnormal; Notable for the following:       Result Value   WBC 13.1 (*)    Neutro Abs 10.2 (*)    Monocytes Absolute 1.3 (*)    All other components within normal limits  COMPREHENSIVE METABOLIC PANEL - Abnormal; Notable for the following:    Glucose, Bld 167 (*)    Creatinine, Ser 1.41 (*)    All other components within normal limits  RAPID STREP SCREEN (NOT AT [facility])  CULTURE, GROUP A STREP ([facility])  LIPASE, BLOOD    EKG  EKG Interpretation None       Radiology No results found.  Procedures Procedures (including critical care time)  Medications Ordered in ED Medications  ketorolac (TORADOL) injection 30 mg (not administered)  ondansetron (ZOFRAN-ODT) disintegrating tablet 8 mg (not administered)     Initial Impression / Assessment and Plan / ED Course  I have reviewed the triage vital signs and the nursing notes.  Pertinent labs & imaging results that were available during my care of the patient were reviewed by me and considered in my medical decision making (see chart for details).     Patient in emergency department with flulike symptoms, sore throat, congestion, cough, nausea and vomiting, diffuse abdominal pain. Patient is  afebrile, nontoxic appearing. Will get strep swab and basic labs. Abdomen is diffusely tender but no peritoneal signs.  11:56 AM Rapid strep is negative. White blood cell count is 13.1. Glucose 167, creatinine 1.41. Otherwise normal labs. Discussed results with patient. Most likely flu like illness, vital signs are normal, no evidence of sepsis, no evidence of peritonsillar abscess. No meningismus. Stable for discharge home was acidotic treatment. Discussed importance of close follow-up regarding his glucose and creatinine. Patient agreed. Will give zofran prescription for nausea vomiting.   Vitals:   06/06/16 0922 06/06/16 0924 06/06/16 0925 06/06/16 1150  BP:   155/99 (!) 148/115  Pulse: 100   79  Resp: 16   16  Temp: 98.2 F (36.8 C)     SpO2: 96%   98%  Weight:  111.1 kg    Height:  6\' 4"  (1.93 m)         Final Clinical Impressions(s) / ED Diagnoses   Final diagnoses:  Viral upper respiratory tract infection  Nausea and vomiting, intractability of vomiting not specified,  unspecified vomiting type  Elevated creatine kinase    New Prescriptions New Prescriptions   ONDANSETRON (ZOFRAN ODT) 8 MG DISINTEGRATING TABLET    Take 1 tablet (8 mg total) by mouth every 8 (eight) hours as needed for nausea or vomiting.     Jaynie Crumble, PA-C 06/06/16 1157    Pricilla Loveless, MD 06/06/16 1536

## 2016-06-06 NOTE — ED Triage Notes (Signed)
Sore throat since last night, headache, nausea since this am.

## 2016-06-08 LAB — CULTURE, GROUP A STREP (THRC)

## 2016-06-22 ENCOUNTER — Emergency Department (HOSPITAL_BASED_OUTPATIENT_CLINIC_OR_DEPARTMENT_OTHER)
Admission: EM | Admit: 2016-06-22 | Discharge: 2016-06-22 | Disposition: A | Discharge status: Home / Self Care | Payer: BC Managed Care – PPO | Attending: Emergency Medicine | Admitting: Emergency Medicine

## 2016-06-22 ENCOUNTER — Encounter (HOSPITAL_BASED_OUTPATIENT_CLINIC_OR_DEPARTMENT_OTHER): Payer: Self-pay

## 2016-06-22 DIAGNOSIS — R609 Edema, unspecified: Secondary | ICD-10-CM | POA: Diagnosis not present

## 2016-06-22 DIAGNOSIS — I10 Essential (primary) hypertension: Secondary | ICD-10-CM | POA: Insufficient documentation

## 2016-06-22 DIAGNOSIS — F17200 Nicotine dependence, unspecified, uncomplicated: Secondary | ICD-10-CM | POA: Diagnosis not present

## 2016-06-22 DIAGNOSIS — M79674 Pain in right toe(s): Secondary | ICD-10-CM

## 2016-06-22 MED ORDER — DOXYCYCLINE HYCLATE 100 MG PO CAPS
100.0000 mg | ORAL_CAPSULE | Freq: Two times a day (BID) | ORAL | 0 refills | Status: DC
Start: 2016-06-22 — End: 2017-08-08

## 2016-06-22 MED ORDER — OXYCODONE-ACETAMINOPHEN 5-325 MG PO TABS
1.0000 | ORAL_TABLET | Freq: Once | ORAL | Status: AC
  Administered 2016-06-22: 1 via ORAL
  Filled 2016-06-22: qty 1

## 2016-06-22 MED ORDER — KETOROLAC TROMETHAMINE 60 MG/2ML IM SOLN
60.0000 mg | Freq: Once | INTRAMUSCULAR | Status: AC
  Administered 2016-06-22: 60 mg via INTRAMUSCULAR
  Filled 2016-06-22: qty 2

## 2016-06-22 MED ORDER — KETOROLAC TROMETHAMINE 15 MG/ML IJ SOLN: 60.0000 mg | Freq: Once | INTRAMUSCULAR | Status: DC

## 2016-06-22 NOTE — Discharge Instructions (Signed)
Please take your prescribed antibiotic.  Take 1000 mg of tylenol and 600 mg ibuprofen every 8 hours for pain.    Please call your primary care doctor for further discussion of your recurrent toe pain.  You may bring up your need for long term control of your pain.  As we discussed I am unable to prescribe you narcotic pain medications from the emergency department for recurrent, chronic problems hopefully your primary care provider can assist in better control of your pain.

## 2016-06-22 NOTE — ED Triage Notes (Signed)
Pt c/o infection to right pinky toe last year-was better-now worse x 1 week-pt wearing sock and post op shoe-limping gait-NAD

## 2016-06-22 NOTE — ED Provider Notes (Signed)
[facility] Note   CSN: [id number] Arrival date & time: 06/22/16  1708   By signing my name below, I, Soijett Blue, attest that this documentation has been prepared under the direction and in the presence of Sharen Heck, PA-C Electronically Signed: Soijett Blue, ED Scribe. 06/22/16. 6:29 PM.  History   Chief Complaint Chief Complaint  Patient presents with  . Toe Pain    HPI Carl Small is a 41 y.o. male with a PMHx of HTN, recurrent infection of right fifth toe and chronic foot pain (followed by Dr. Ardelle Anton)  who presents to the Emergency Department complaining of gradually worsening right 5th toe pain onset 4 days ago. Pt reports associated redness, warmth and tenderness to right little toe.  Patient states this is similar to his previous toe "flares", which have been happening, recurrently for the past year.  Patient states he was seen in ED for same last year, has then followed up with his PCP who referred him to podiatrist who he last saw December 2017.  Patient states he has gotten blood work, MRI and x-ray for his recurrent toe infection.  He states he was tested for gout and his results were normal. Pt has not tried any medications for the relief of his symptoms.  Pt reports that prior to 4 days ago, he was pain free to the affected area for about one month. Pt states that he had a right hip replacement due to avascular necrosis in September 2017 prior to the onset of his symptoms. He denies trauma prior to the onset of his symptoms four days ago. He denies fever, chills, and any other symptoms. Denies PMHx of DM or IV drug use. Pain reports pain is severe.    The history is provided by the patient. No language interpreter was used.    Past Medical History:  Diagnosis Date  . Hypertension   . Torsion of testicle     Patient Active Problem List   Diagnosis Date Noted  . Hematemesis 12/14/2015  . Leukocytosis 12/14/2015  . Avascular necrosis of bone  of hip (HCC) 12/12/2015  . Essential hypertension 12/06/2015  . 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 Osteoarthritis, hip, bilateral 12/02/2015    Past Surgical History:  Procedure Laterality Date  . FRACTURE SURGERY Right   . TESTICLE SURGERY  1996  . TOTAL HIP ARTHROPLASTY Right 12/12/2015   Procedure: TOTAL HIP ARTHROPLASTY ANTERIOR APPROACH;  Surgeon: Gean Birchwood, MD;  Location: [facility] OR;  Service: Orthopedics;  Laterality: Right;       Home Medications    Prior to Admission medications   Medication Sig Start Date End Date Taking? Authorizing Provider  doxycycline (VIBRAMYCIN) 100 MG capsule Take 1 capsule (100 mg total) by mouth 2 (two) times daily. 06/22/16   Liberty Handy, PA-C  hydrochlorothiazide (HYDRODIURIL) 25 MG tablet Take 1 tablet (25 mg total) by mouth daily. 12/06/15   Magdalene River, PA-C    Family History Family History  Problem Relation Age of Onset  . Heart disease Mother   . Hypertension Mother   . Diabetes Mother   . Epilepsy Mother     Social History Social History  Substance Use Topics  . Smoking status: Current Every Day Smoker    Packs/day: 0.25    Years: 18.00  . Smokeless tobacco: Never Used  . Alcohol use 7.2 oz/week    12 Standard drinks or equivalent per week     Comment:  weekends     Allergies   Penicillins; Sulfa antibiotics; and Amoxicillin   Review of Systems Review of Systems  Constitutional: Negative for chills and fever.  Musculoskeletal: Positive for arthralgias (right little toe) and joint swelling (right little toe).  Skin: Positive for color change (redness to right little toe).     Physical Exam Updated Vital Signs BP (!) 154/100 (BP Location: Left Arm)   Pulse 93   Temp 98.3 F (36.8 C) (Oral)   Resp 20   Ht  (1.88 m)   Wt 111.1 kg   SpO2 97%   BMI 31.46 kg/m   Physical Exam  Constitutional: He is oriented to person, place, and time. He appears well-developed and  well-nourished. No distress.  HENT:  Head: Normocephalic and atraumatic.  Eyes: EOM are normal.  Neck: Neck supple.  Cardiovascular: Normal rate.   Pulmonary/Chest: Effort normal. No respiratory distress.  Abdominal: Soft. He exhibits no distension.  Musculoskeletal: Normal range of motion.       Right foot: There is tenderness, swelling and deformity.       Feet:  Right 5th toe with mild edema, warmth and tenderness with light palpation. Erythema and edema is limited to 5th toe only, does extend or streak up right foot or lower extremity. Decreased right 5th toe active ROM secondary to pain, full active ROM with severe pain reported No bony tenderness along 5th toe metatarsal, malleoli or heel  Neurological: He is alert and oriented to person, place, and time.  Right foot: sensation to light touch intact in the distribution of the saphenous nerve, medial plantar nerve, lateral plantar nerve, bilaterally.   Skin: Skin is warm and dry.  Psychiatric: He has a normal mood and affect. His behavior is normal.  Nursing note and vitals reviewed.    ED Treatments / Results  DIAGNOSTIC STUDIES: Oxygen Saturation is 97% on RA, nl by my interpretation.    COORDINATION OF CARE: 6:28 PM Discussed treatment plan with pt at bedside which includes consult with attending, and pt agreed to plan.   Labs (all labs ordered are listed, but only abnormal results are displayed) Labs Reviewed - No data to display  EKG  EKG Interpretation None       Radiology No results found.  Procedures Procedures (including critical care time)  Medications Ordered in ED Medications  oxyCODONE-acetaminophen (PERCOCET/ROXICET) 5-325 MG per tablet 1 tablet (1 tablet Oral Given 06/22/16 1858)  ketorolac (TORADOL) injection 60 mg (60 mg Intramuscular Given 06/22/16 1859)     Initial Impression / Assessment and Plan / ED Course  I have reviewed the triage vital signs and the nursing notes.  Pertinent labs  & imaging results that were available during my care of the patient were reviewed by me and considered in my medical decision making (see chart for details).    HPI and physical exam findings suggestive of cellulitis to right 5th toe, less likely gout or trauma.  This is a recurrent problem.  Patient has been worked up for gout (uric acid on October 2017 normal).  No IVDU. No h/o DM. Patient has had extensive work up including MRI and x-rays of toe with no diagnosis.  Patient followed by podiatrist (last seen December 2017), who has advised ID follow up which patient has not done.  MRI shows cyst in distal aspect of this toe and chronic deformity, podiatrist does not attribute recurrent symptoms to this.  Please see podiatrist progress note for further details.  Patient  is frustrated today, expresses frustration with recurrent episodes of pain that he quantifies as severe and debilitating.  On exam there is mild edema and erythema to 5th right toe only that does not extend past the base of the 5th toe.  Patient has no systemic symptoms, neurovascularly intact.  I do not think x-ray or lab work is indicated today.  Low suspicion for septic arthritis at this time.  Will discharge with doxycycline which is the antibiotic that was previously prescribed and which was successful.  Patient given toradol and percocet in ED.  I reviewed [place] narcotic data base, patient has been intermittently prescribed narcotic pain medications since October 2017 for this pain by podiatrist.  Given chronicity and recurrence of symptoms I will not prescribe narcotic pain medications today.  Patient became agitated, stating I am "doing nothing for his pain" and that he will probably come back tomorrow and every day for pain control. I advised patient to take tylenol + ibuprofen and f/u with PCP for long term control of pain.    Patient, ED treatment and discharge plan was discussed with supervising physician who is agreeable with plan.    Final Clinical Impressions(s) / ED Diagnoses   Final diagnoses:  Pain of toe of right foot    New Prescriptions Discharge Medication List as of 06/22/2016  7:15 PM    START taking these medications   Details  doxycycline (VIBRAMYCIN) 100 MG capsule Take 1 capsule (100 mg total) by mouth 2 (two) times daily., Starting Fri 06/22/2016, Print       I personally performed the services described in this documentation, which was scribed in my presence. The recorded information has been reviewed and is accurate.     Liberty Handy, PA-C 06/22/16 1956    Jerelyn Scott, MD 06/22/16 [phone]

## 2016-08-23 ENCOUNTER — Telehealth: Payer: Self-pay | Admitting: Podiatry

## 2016-08-23 NOTE — Telephone Encounter (Signed)
Pt called in wanting to know about his orthotics and if they where in

## 2016-09-04 ENCOUNTER — Telehealth: Payer: Self-pay | Admitting: *Deleted

## 2016-09-04 NOTE — Telephone Encounter (Signed)
Patient came by and picked up his inserts and stated the reason he had not come and got them was [place] and when he [place] home, he had to change his number due to a "stalker" and was sorry for not letting us know. [name]

## 2017-03-31 ENCOUNTER — Other Ambulatory Visit: Payer: Self-pay | Admitting: Physician Assistant

## 2017-03-31 DIAGNOSIS — I10 Essential (primary) hypertension: Secondary | ICD-10-CM

## 2017-07-24 ENCOUNTER — Other Ambulatory Visit: Payer: Self-pay

## 2017-07-24 ENCOUNTER — Encounter: Payer: Self-pay | Admitting: Emergency Medicine

## 2017-07-24 DIAGNOSIS — M545 Low back pain: Secondary | ICD-10-CM | POA: Insufficient documentation

## 2017-07-24 DIAGNOSIS — Z5321 Procedure and treatment not carried out due to patient leaving prior to being seen by health care provider: Secondary | ICD-10-CM | POA: Diagnosis not present

## 2017-07-24 LAB — URINALYSIS, COMPLETE (UACMP) WITH MICROSCOPIC
BACTERIA UA: NONE SEEN
BILIRUBIN URINE: NEGATIVE
Glucose, UA: NEGATIVE mg/dL
Hgb urine dipstick: NEGATIVE
KETONES UR: NEGATIVE mg/dL
Leukocytes, UA: NEGATIVE
Nitrite: NEGATIVE
Protein, ur: NEGATIVE mg/dL
SPECIFIC GRAVITY, URINE: 1.02 (ref 1.005–1.030)
SQUAMOUS EPITHELIAL / LPF: NONE SEEN (ref 0–5)
pH: 6 (ref 5.0–8.0)

## 2017-07-24 NOTE — ED Triage Notes (Signed)
Pt arrives ambulatory to triage with c/o lower right back pain. Pt denies injury or trauma at this time. Pt does reports right hip surgery 11/2015. Pt is in NAD.

## 2017-07-25 ENCOUNTER — Emergency Department
Admission: EM | Admit: 2017-07-25 | Discharge: 2017-07-25 | Disposition: A | Discharge status: Home / Self Care | Payer: BC Managed Care – PPO | Attending: Emergency Medicine | Admitting: Emergency Medicine

## 2017-08-08 ENCOUNTER — Emergency Department (HOSPITAL_BASED_OUTPATIENT_CLINIC_OR_DEPARTMENT_OTHER): Payer: BC Managed Care – PPO

## 2017-08-08 ENCOUNTER — Encounter (HOSPITAL_BASED_OUTPATIENT_CLINIC_OR_DEPARTMENT_OTHER): Payer: Self-pay | Admitting: Emergency Medicine

## 2017-08-08 ENCOUNTER — Emergency Department (HOSPITAL_BASED_OUTPATIENT_CLINIC_OR_DEPARTMENT_OTHER)
Admission: EM | Admit: 2017-08-08 | Discharge: 2017-08-08 | Disposition: A | Discharge status: Home / Self Care | Payer: BC Managed Care – PPO | Attending: Physician Assistant | Admitting: Physician Assistant

## 2017-08-08 ENCOUNTER — Other Ambulatory Visit: Payer: Self-pay

## 2017-08-08 DIAGNOSIS — L03115 Cellulitis of right lower limb: Secondary | ICD-10-CM | POA: Diagnosis not present

## 2017-08-08 DIAGNOSIS — Z79899 Other long term (current) drug therapy: Secondary | ICD-10-CM | POA: Insufficient documentation

## 2017-08-08 DIAGNOSIS — I10 Essential (primary) hypertension: Secondary | ICD-10-CM | POA: Diagnosis not present

## 2017-08-08 DIAGNOSIS — M79671 Pain in right foot: Secondary | ICD-10-CM | POA: Diagnosis present

## 2017-08-08 DIAGNOSIS — F17200 Nicotine dependence, unspecified, uncomplicated: Secondary | ICD-10-CM | POA: Insufficient documentation

## 2017-08-08 DIAGNOSIS — Z96641 Presence of right artificial hip joint: Secondary | ICD-10-CM | POA: Diagnosis not present

## 2017-08-08 MED ORDER — DOXYCYCLINE HYCLATE 100 MG PO CAPS: 100.0000 mg | ORAL_CAPSULE | Freq: Two times a day (BID) | ORAL | 0 refills | Status: DC

## 2017-08-08 MED ORDER — HYDROCODONE-ACETAMINOPHEN 5-325 MG PO TABS
1.0000 | ORAL_TABLET | Freq: Once | ORAL | Status: AC
  Administered 2017-08-08: 1 via ORAL
  Filled 2017-08-08: qty 1

## 2017-08-08 NOTE — ED Triage Notes (Signed)
PT presents with c/o abscess to right foot for a couple days. Pt states he was st urgent but they were closing and unable to see him and on soaked it and it started draining.

## 2017-08-08 NOTE — ED Provider Notes (Signed)
[facility] EMERGENCY DEPARTMENT Provider Note   CSN: [id number] Arrival date & time: 08/08/17  2004     History   Chief Complaint Chief Complaint  Patient presents with  . Abscess    HPI Carl Small is a 42 y.o. male presenting for evaluation of right foot pain.  Patient states for the past several days, he has had gradually increasing pain of his right lateral foot, beginning at the toes.  He states that he noticed some drainage coming from his foot, but cannot demonstrate where.  This occurred this evening.  He denies fevers, chills, nausea, vomiting, abdominal pain.  He denies numbness or tingling.  He has had issues with cellulitis and abscesses of his foot in the past, but has not followed up with a podiatrist.  He is on medication for blood pressure, has no other medical problems.  Is not immunocompromised.  Is up-to-date on his vaccines.  Denies fall, trauma, or injury.  He has not taken anything for pain including Tylenol or ibuprofen.  Palpation and ambulation makes pain worse, nothing makes it better.  No history of diabetes.  HPI  Past Medical History:  Diagnosis Date  . Hypertension   . Torsion of testicle     Patient Active Problem List   Diagnosis Date Noted  . Hematemesis 12/14/2015  . Leukocytosis 12/14/2015  . Avascular necrosis of bone of hip (HCC) 12/12/2015  . Essential hypertension 12/06/2015  . 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 Osteoarthritis, hip, bilateral 12/02/2015    Past Surgical History:  Procedure Laterality Date  . FRACTURE SURGERY Right   . TESTICLE SURGERY  1996  . TOTAL HIP ARTHROPLASTY Right 12/12/2015   Procedure: TOTAL HIP ARTHROPLASTY ANTERIOR APPROACH;  Surgeon: Gean Birchwood, MD;  Location: [facility] OR;  Service: Orthopedics;  Laterality: Right;        Home Medications    Prior to Admission medications   Medication Sig Start Date End Date Taking? Authorizing Provider  doxycycline (VIBRAMYCIN)  100 MG capsule Take 1 capsule (100 mg total) by mouth 2 (two) times daily. 08/08/17   Dimitrius Steedman, PA-C  hydrochlorothiazide (HYDRODIURIL) 25 MG tablet Take 1 tablet (25 mg total) by mouth daily. 12/06/15   Magdalene River, PA-C    Family History Family History  Problem Relation Age of Onset  . Heart disease Mother   . Hypertension Mother   . Diabetes Mother   . Epilepsy Mother     Social History Social History   Tobacco Use  . Smoking status: Current Every Day Smoker    Packs/day: 0.25    Years: 18.00    Pack years: 4.50  . Smokeless tobacco: Never Used  Substance Use Topics  . Alcohol use: Yes    Alcohol/week: 7.2 oz    Types: 12 Standard drinks or equivalent per week    Comment: weekends  . Drug use: No     Allergies   Penicillins; Sulfa antibiotics; and Amoxicillin   Review of Systems Review of Systems  Musculoskeletal: Positive for myalgias.  Skin: Positive for color change.     Physical Exam Updated Vital Signs BP (!) 149/109 (BP Location: Right Arm)   Pulse 84   Temp 98.2 F (36.8 C) (Oral)   Resp 16   Ht  (1.88 m)   Wt 106.6 kg (235 lb)   SpO2 100%   BMI 30.17 kg/m   Physical Exam  Constitutional: He is oriented to person, place, and  time. He appears well-developed and well-nourished. No distress.  HENT:  Head: Normocephalic and atraumatic.  Eyes: EOM are normal.  Neck: Normal range of motion.  Cardiovascular: Normal rate, regular rhythm and intact distal pulses.  Pulmonary/Chest: Effort normal and breath sounds normal. No respiratory distress. He has no wheezes.  Abdominal: He exhibits no distension.  Musculoskeletal: Normal range of motion. He exhibits edema and tenderness.  Pedal pulses intact bilaterally.  Tenderness and mild swelling of the right lateral foot with mild erythema.  No streaking.  No obvious abscess or drainage.  No obvious injury.  Neurological: He is alert and oriented to person, place, and time. No sensory  deficit.  Skin: Skin is warm. Capillary refill takes less than 2 seconds. No rash noted. There is erythema.  Psychiatric: He has a normal mood and affect.  Nursing note and vitals reviewed.    ED Treatments / Results  Labs (all labs ordered are listed, but only abnormal results are displayed) Labs Reviewed - No data to display  EKG None  Radiology Dg Foot Complete Right  Result Date: 08/08/2017 CLINICAL DATA:  Right foot pain and swelling, no known injury, initial encounter EXAM: RIGHT FOOT COMPLETE - 3+ VIEW COMPARISON:  None. FINDINGS: Mild soft tissue swelling is noted laterally. Chronic changes in the distal aspect of the fifth digit are noted and stable. No new erosive changes are seen. No acute fracture is noted. IMPRESSION: Chronic changes without acute abnormality. Electronically Signed   By: Alcide Clever M.D.   On: 08/08/2017 21:27      Procedures Procedures (including critical care time)  Medications Ordered in ED Medications  HYDROcodone-acetaminophen (NORCO/VICODIN) 5-325 MG per tablet 1 tablet (1 tablet Oral Given 08/08/17 2123)     Initial Impression / Assessment and Plan / ED Course  I have reviewed the triage vital signs and the nursing notes.  Pertinent labs & imaging results that were available during my care of the patient were reviewed by me and considered in my medical decision making (see chart for details).     Patient presenting for evaluation of right foot pain, swelling, and warmth.  Physical exam shows patient with cellulitis without obvious abscess.  Neurovascularly intact. Will obtain xray to r/o osteo, although low suspicion.   Xray viewed and interpreted by me, no osteo.  Ultrasound did not show obvious abscess.  Will treat for cellulitis.  Follow-up with podiatry encouraged.  Discussed return precautions.  At this time, patient appears safe for discharge.  Patient states he understands and agrees plan.   Final Clinical Impressions(s) / ED  Diagnoses   Final diagnoses:  Cellulitis of right lower extremity    ED Discharge Orders        Ordered    doxycycline (VIBRAMYCIN) 100 MG capsule  2 times daily     08/08/17 2230       Alveria Apley, PA-C 08/08/17 2325    Abelino Derrick, MD 08/08/17 2342

## 2017-08-08 NOTE — Discharge Instructions (Signed)
Take antibiotics as prescribed.  Take the entire course, even if your symptoms improve. Tylenol or ibuprofen as needed for pain. Follow-up with podiatry for their evaluation of your foot. Return to the emergency room if you develop high fevers, vomiting, worsening swelling, or any new or concerning symptoms.

## 2017-10-10 ENCOUNTER — Encounter: Payer: Self-pay | Admitting: *Deleted

## 2017-10-10 ENCOUNTER — Emergency Department
Admission: EM | Admit: 2017-10-10 | Discharge: 2017-10-10 | Disposition: A | Discharge status: Home / Self Care | Payer: BC Managed Care – PPO | Attending: Emergency Medicine | Admitting: Emergency Medicine

## 2017-10-10 ENCOUNTER — Other Ambulatory Visit: Payer: Self-pay

## 2017-10-10 DIAGNOSIS — B351 Tinea unguium: Secondary | ICD-10-CM | POA: Diagnosis not present

## 2017-10-10 DIAGNOSIS — B353 Tinea pedis: Secondary | ICD-10-CM | POA: Diagnosis not present

## 2017-10-10 DIAGNOSIS — M79674 Pain in right toe(s): Secondary | ICD-10-CM | POA: Diagnosis present

## 2017-10-10 DIAGNOSIS — L03115 Cellulitis of right lower limb: Secondary | ICD-10-CM | POA: Diagnosis not present

## 2017-10-10 MED ORDER — HYDROCODONE-ACETAMINOPHEN 5-325 MG PO TABS: 1.0000 | ORAL_TABLET | Freq: Four times a day (QID) | ORAL | 0 refills | Status: DC | PRN

## 2017-10-10 MED ORDER — DOXYCYCLINE HYCLATE 100 MG PO CAPS: 100.0000 mg | ORAL_CAPSULE | Freq: Two times a day (BID) | ORAL | 0 refills | Status: DC

## 2017-10-10 NOTE — ED Provider Notes (Signed)
[facility] Emergency Department Provider Note   ____________________________________________   First MD Initiated Contact with Patient 10/10/17 1456     (approximate)  I have reviewed the triage vital signs and the nursing notes.   HISTORY  Chief Complaint Toe Pain   HPI Carl Small is a 42 y.o. male here complaint of right fifth toe pain.  Patient states that also in the last day his foot has become red on the dorsal aspect.  Denies any injury.  He denies any fever, chills, nausea or vomiting.  There is no history of diabetes.  He rates his pain is not over 10.   Past Medical History:  Diagnosis Date  . Hypertension   . Torsion of testicle     Patient Active Problem List   Diagnosis Date Noted  . Hematemesis 12/14/2015  . Leukocytosis 12/14/2015  . Avascular necrosis of bone of hip (HCC) 12/12/2015  . Essential hypertension 12/06/2015  . 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 Osteoarthritis, hip, bilateral 12/02/2015    Past Surgical History:  Procedure Laterality Date  . FRACTURE SURGERY Right   . TESTICLE SURGERY  1996  . TOTAL HIP ARTHROPLASTY Right 12/12/2015   Procedure: TOTAL HIP ARTHROPLASTY ANTERIOR APPROACH;  Surgeon: Gean BirchwoodFrank Rowan, MD;  Location: [facility] OR;  Service: Orthopedics;  Laterality: Right;    Prior to Admission medications   Medication Sig Start Date End Date Taking? Authorizing Provider  doxycycline (VIBRAMYCIN) 100 MG capsule Take 1 capsule (100 mg total) by mouth 2 (two) times daily. 10/10/17   Tommi RumpsSummers, Rhonda L, PA-C  hydrochlorothiazide (HYDRODIURIL) 25 MG tablet Take 1 tablet (25 mg total) by mouth daily. 12/06/15   Benjiman CoreWiseman, Brittany D, PA-C  HYDROcodone-acetaminophen (NORCO/VICODIN) 5-325 MG tablet Take 1 tablet by mouth every 6 (six) hours as needed for moderate pain. 10/10/17   Tommi RumpsSummers, Rhonda L, PA-C    Allergies Penicillins; Sulfa antibiotics; and Amoxicillin  Family History  Problem  Relation Age of Onset  . Heart disease Mother   . Hypertension Mother   . Diabetes Mother   . Epilepsy Mother     Social History Social History   Tobacco Use  . Smoking status: Current Every Day Smoker    Packs/day: 0.25    Years: 18.00    Pack years: 4.50  . Smokeless tobacco: Never Used  Substance Use Topics  . Alcohol use: Yes    Alcohol/week: 7.2 oz    Types: 12 Standard drinks or equivalent per week    Comment: weekends  . Drug use: No    Review of Systems Constitutional: No fever/chills Cardiovascular: Denies chest pain. Respiratory: Denies shortness of breath. Musculoskeletal: Positive right foot pain. Skin: Positive erythema right foot. Neurological: Negative for headaches, focal weakness or numbness. ____________________________________________   PHYSICAL EXAM:  VITAL SIGNS: ED Triage Vitals [10/10/17 1443]  Enc Vitals Group     BP 126/90     Pulse Rate 86     Resp 16     Temp 98.2 F (36.8 C)     Temp Source Oral     SpO2 98 %     Weight 232 lb (105.2 kg)     Height 6\' 2"  (1.88 m)     Head Circumference      Peak Flow      Pain Score 9     Pain Loc      Pain Edu?      Excl. in GC?  Constitutional: Alert and oriented. Well appearing and in no acute distress. Eyes: Conjunctivae are normal.  Head: Atraumatic. Neck: No stridor.   Cardiovascular: Normal rate, regular rhythm. Grossly normal heart sounds.  Good peripheral circulation. Respiratory: Normal respiratory effort.  No retractions. Lungs CTAB. Musculoskeletal: Examination of the right foot there is no gross deformity however is noted that patient has multiple fungal nail infections.  Patient is able to ambulate without assistance.  Range of motion is without restriction. Neurologic:  Normal speech and language. No gross focal neurologic deficits are appreciated.  Skin:  Skin is warm, dry.  Dorsal aspect of the right foot on the lateral aspect is erythematous.  On closer inspection between  the fourth and fifth digits there is moderate amount of athletes feet present with skin involvement and cracking.  No drainage is noted.  Area is moderately tender to palpation. Psychiatric: Mood and affect are normal. Speech and behavior are normal.  ____________________________________________   LABS (all labs ordered are listed, but only abnormal results are displayed)  Labs Reviewed - No data to display  PROCEDURES  Procedure(s) performed: None  Procedures  Critical Care performed: No  ____________________________________________   INITIAL IMPRESSION / ASSESSMENT AND PLAN / ED COURSE  As part of my medical decision making, I reviewed the following data within the electronic MEDICAL RECORD NUMBER Notes from prior ED visits and [place] Controlled Substance Database  Patient has cellulitis of his right foot secondary to his tinea pedis.  Patient is encouraged to follow-up with his PCP for any continued problems and possible treatment of his fungal nails however they are heavily involved and may not be possible.  Patient was started on doxycycline 100 mg twice daily for 10 days.  He is allergic to penicillin and sulfa drugs.  He was also given a prescription for Norco 1 every 6 hours as needed for pain.  He is instructed to soak his foot in warm water.  He is to elevate as needed for swelling and decreased pain.  He will return to the emergency department if any severe worsening of his symptoms.  ____________________________________________   FINAL CLINICAL IMPRESSION(S) / ED DIAGNOSES  Final diagnoses:  Tinea pedis, right  Fungal infection of toenail  Cellulitis of right foot     ED Discharge Orders        Ordered    doxycycline (VIBRAMYCIN) 100 MG capsule  2 times daily     10/10/17 1531    HYDROcodone-acetaminophen (NORCO/VICODIN) 5-325 MG tablet  Every 6 hours PRN     10/10/17 1531       Note:  This document was prepared using Dragon voice recognition software and may  include unintentional dictation errors.    Tommi Rumps, PA-C 10/10/17 1558    Jene Every, MD 10/15/17 0700

## 2017-10-10 NOTE — ED Triage Notes (Addendum)
Pt to ED reporting right pinky toe has been changing colors and swelling for the past three to four days. Pt has not injured toe and denies having any pain with movement of right pinky toe or foot. No numbness or decreased sensation. Pt denies having hx of gout and has had uric acid checked three different times and never had an elevated level. Pt is not a diabetic.   Pedal pulse is intact and equal bilaterally. Right foot is warm to the touch.

## 2017-10-10 NOTE — ED Notes (Signed)
See triage note  Presents with right 5 th toe pain for the past 3-4 days denies any injury  Toe is discolored and foot is swollen

## 2017-10-10 NOTE — Discharge Instructions (Signed)
Follow-up with your primary care provider if any continued problems or not improving.  Soak your foot in warm water 2-3 times a day.  Begin taking antibiotics twice a day for the next 10 days.  Be aware that you may burn if you are out in the sun for long periods of time because of the antibiotic.  Norco every 6 hours as needed for pain.  Do not take this medication and drive or operate machinery.

## 2018-01-15 IMAGING — DX DG HIP (WITH OR WITHOUT PELVIS) 2-3V*R*
3 series · 3 of 3 positions shown · non-contrast
Comparison: None.

CLINICAL DATA: Right hip pain and popping. Tender over the anterior
thigh

EXAM:
DG HIP (WITH OR WITHOUT PELVIS) 2-3V RIGHT

[pelvis ap]
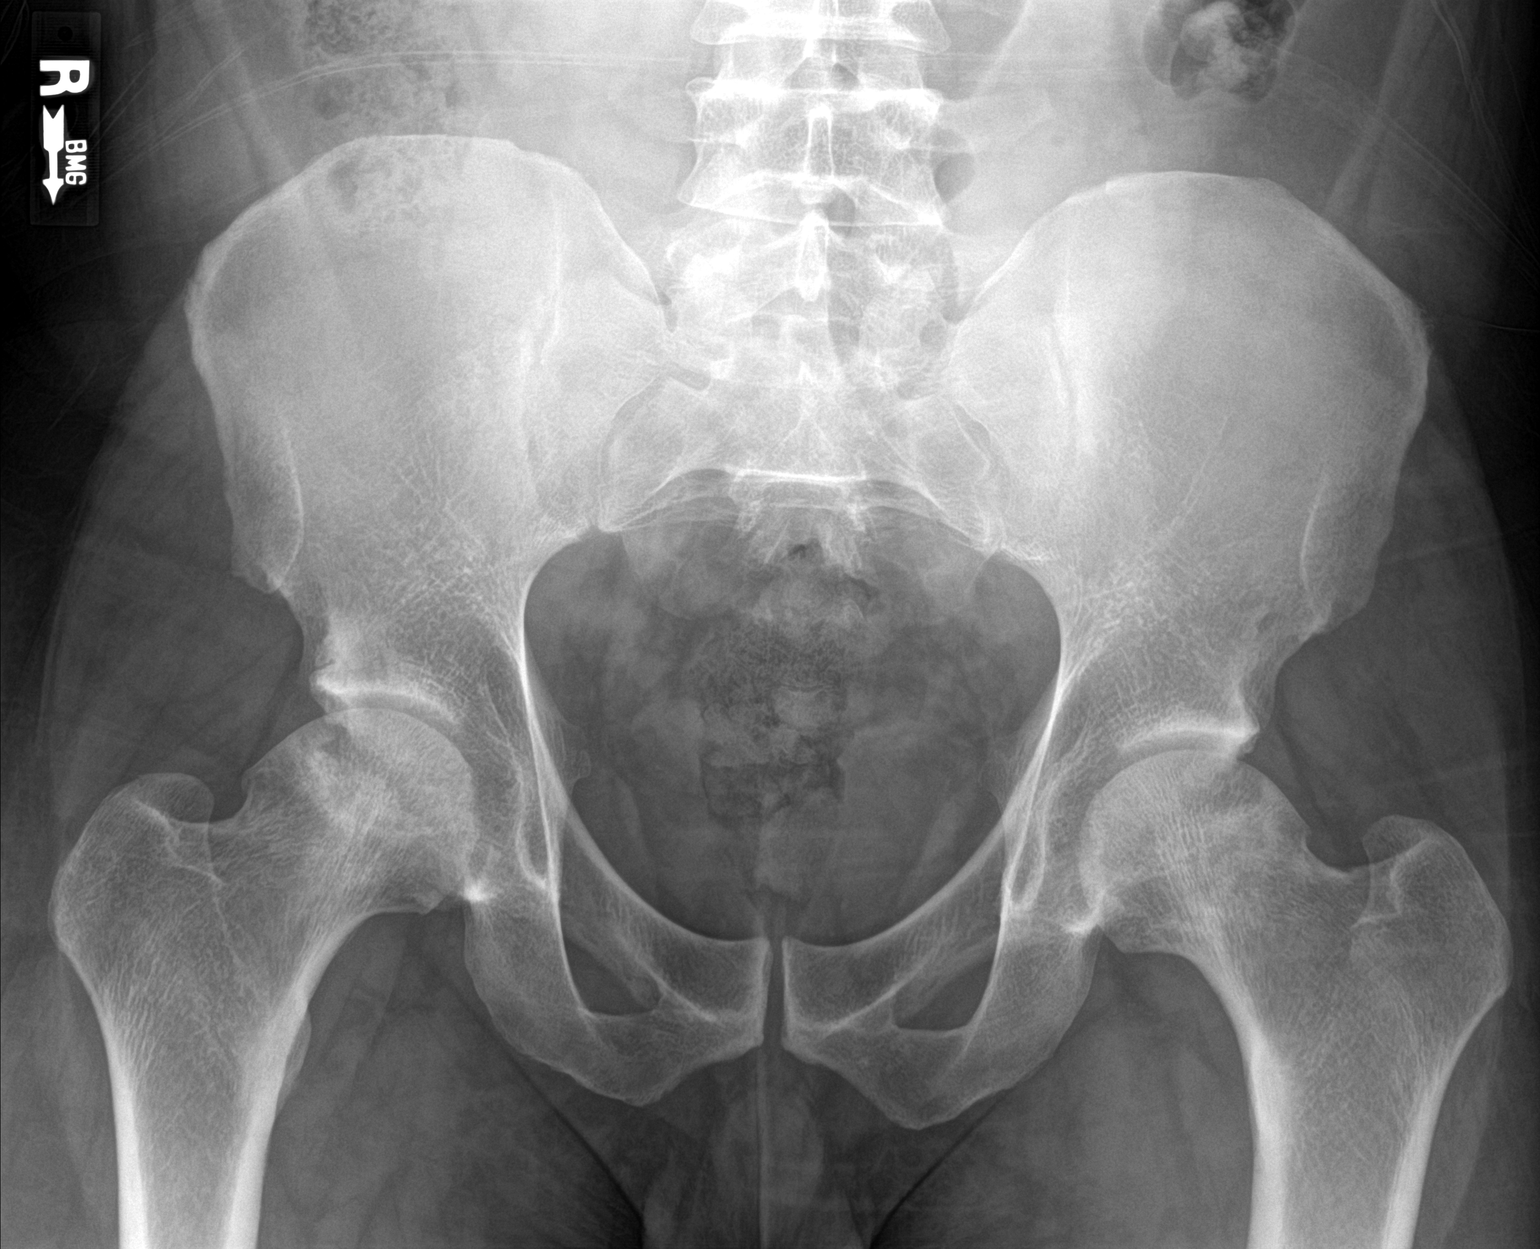

[hip ap]
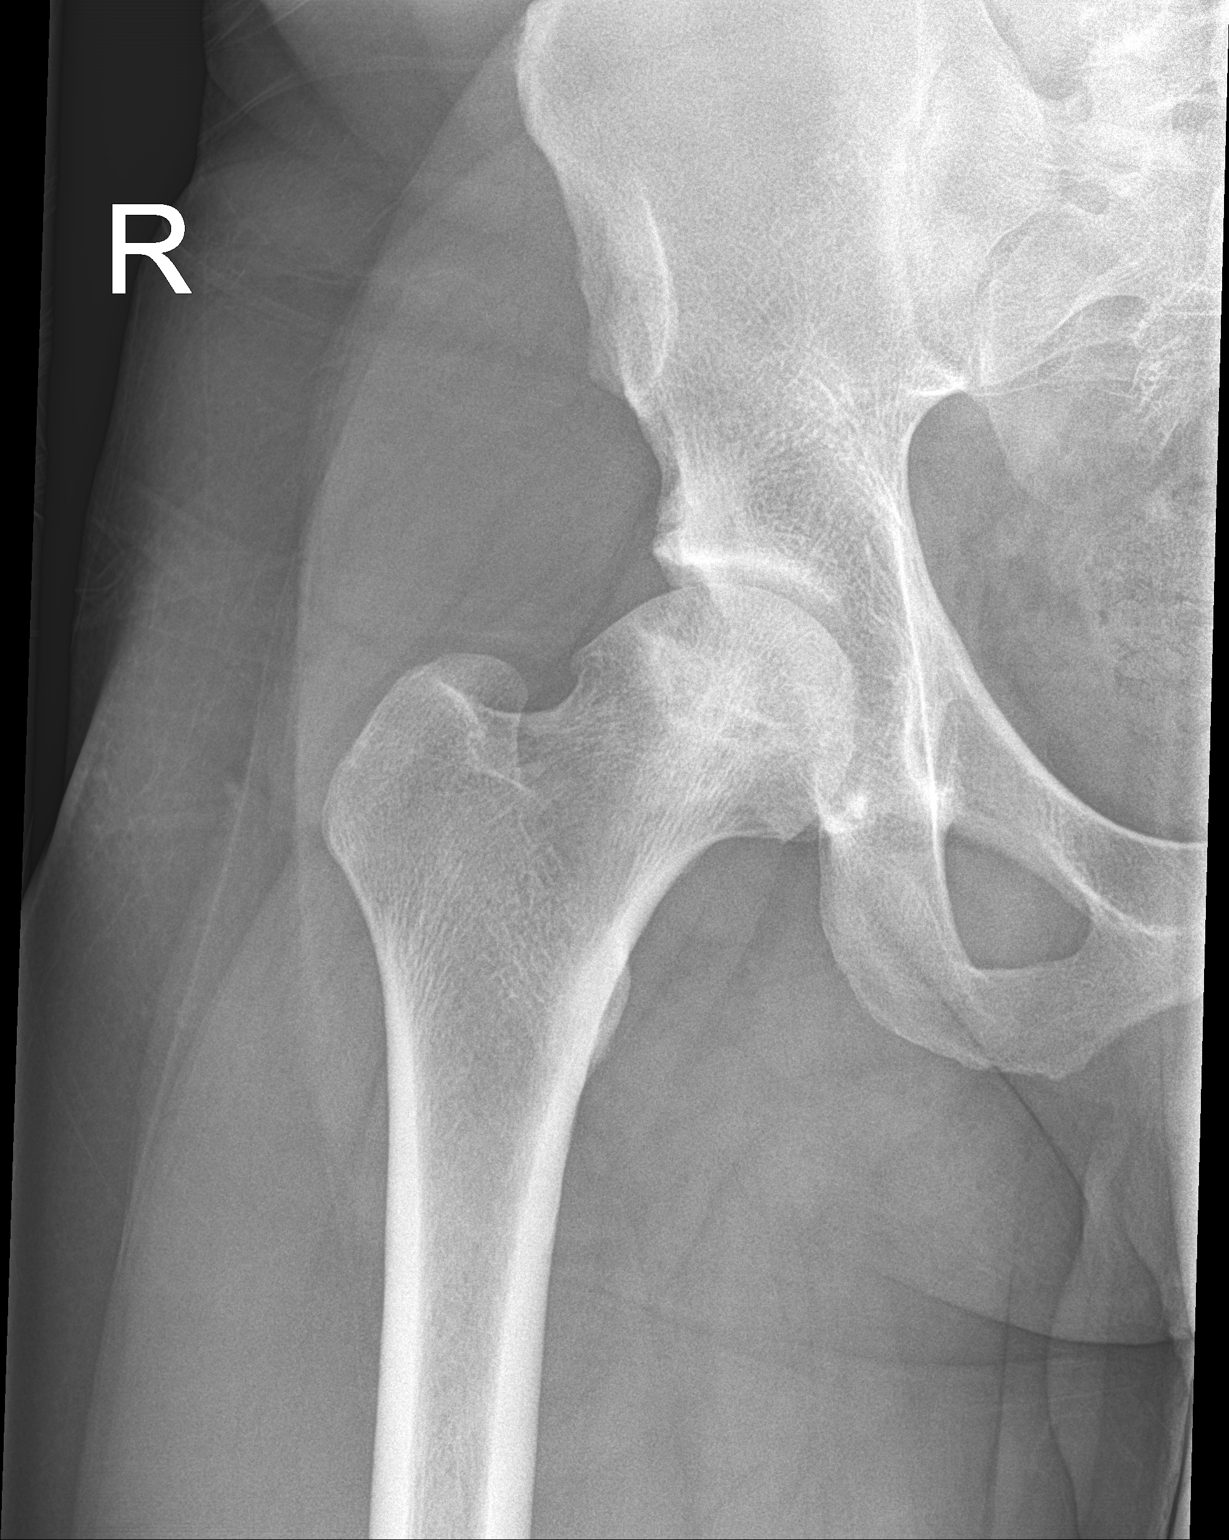

[hip lat]
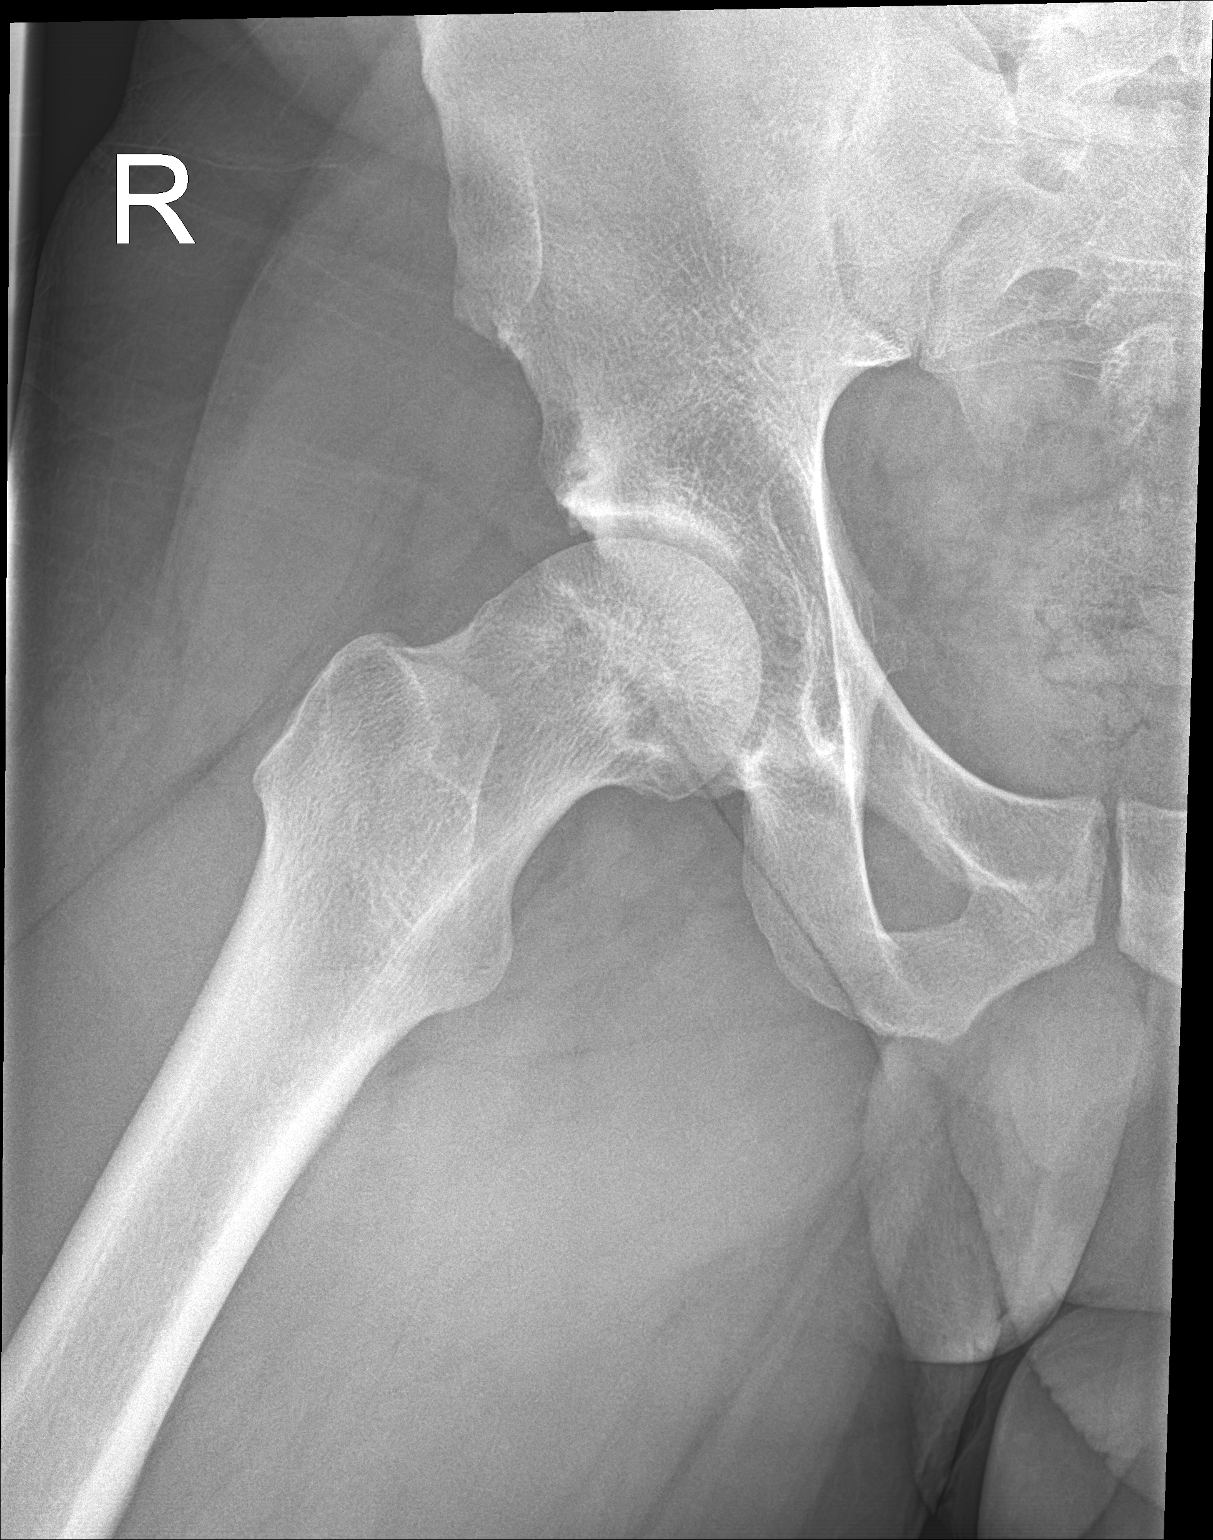

[3 of 3 positions shown; findings below may reference images not displayed]

FINDINGS: The bony pelvis is subjectively adequately mineralized. There is no
lytic nor blastic lesion. AP and lateral views of the right hip
reveal preservation of the joint space. The articular surfaces of
the femoral head and acetabulum remains smoothly rounded. There is
scleroses within portions of the femoral head. The femoral neck,
intertrochanteric, and subtrochanteric regions are normal.
IMPRESSION: 1. No acute fracture or dislocation of the right hip is observed.
2. Scleroses within the right femoral head not clearly related to
the articular surface. This could reflect avascular necrosis in the
appropriate clinical setting. This was not visible on the CT images
of the hips May 20, 2007. MRI of the right hip would be a
useful next imaging step.

## 2018-02-12 IMAGING — CR DG CHEST 2V
2 series · 2 of 2 positions shown · non-contrast
Comparison: Radiographs December 09, 2015.

CLINICAL DATA: Chest pain, hemoptysis.

EXAM:
CHEST  2 VIEW

[chest pa]
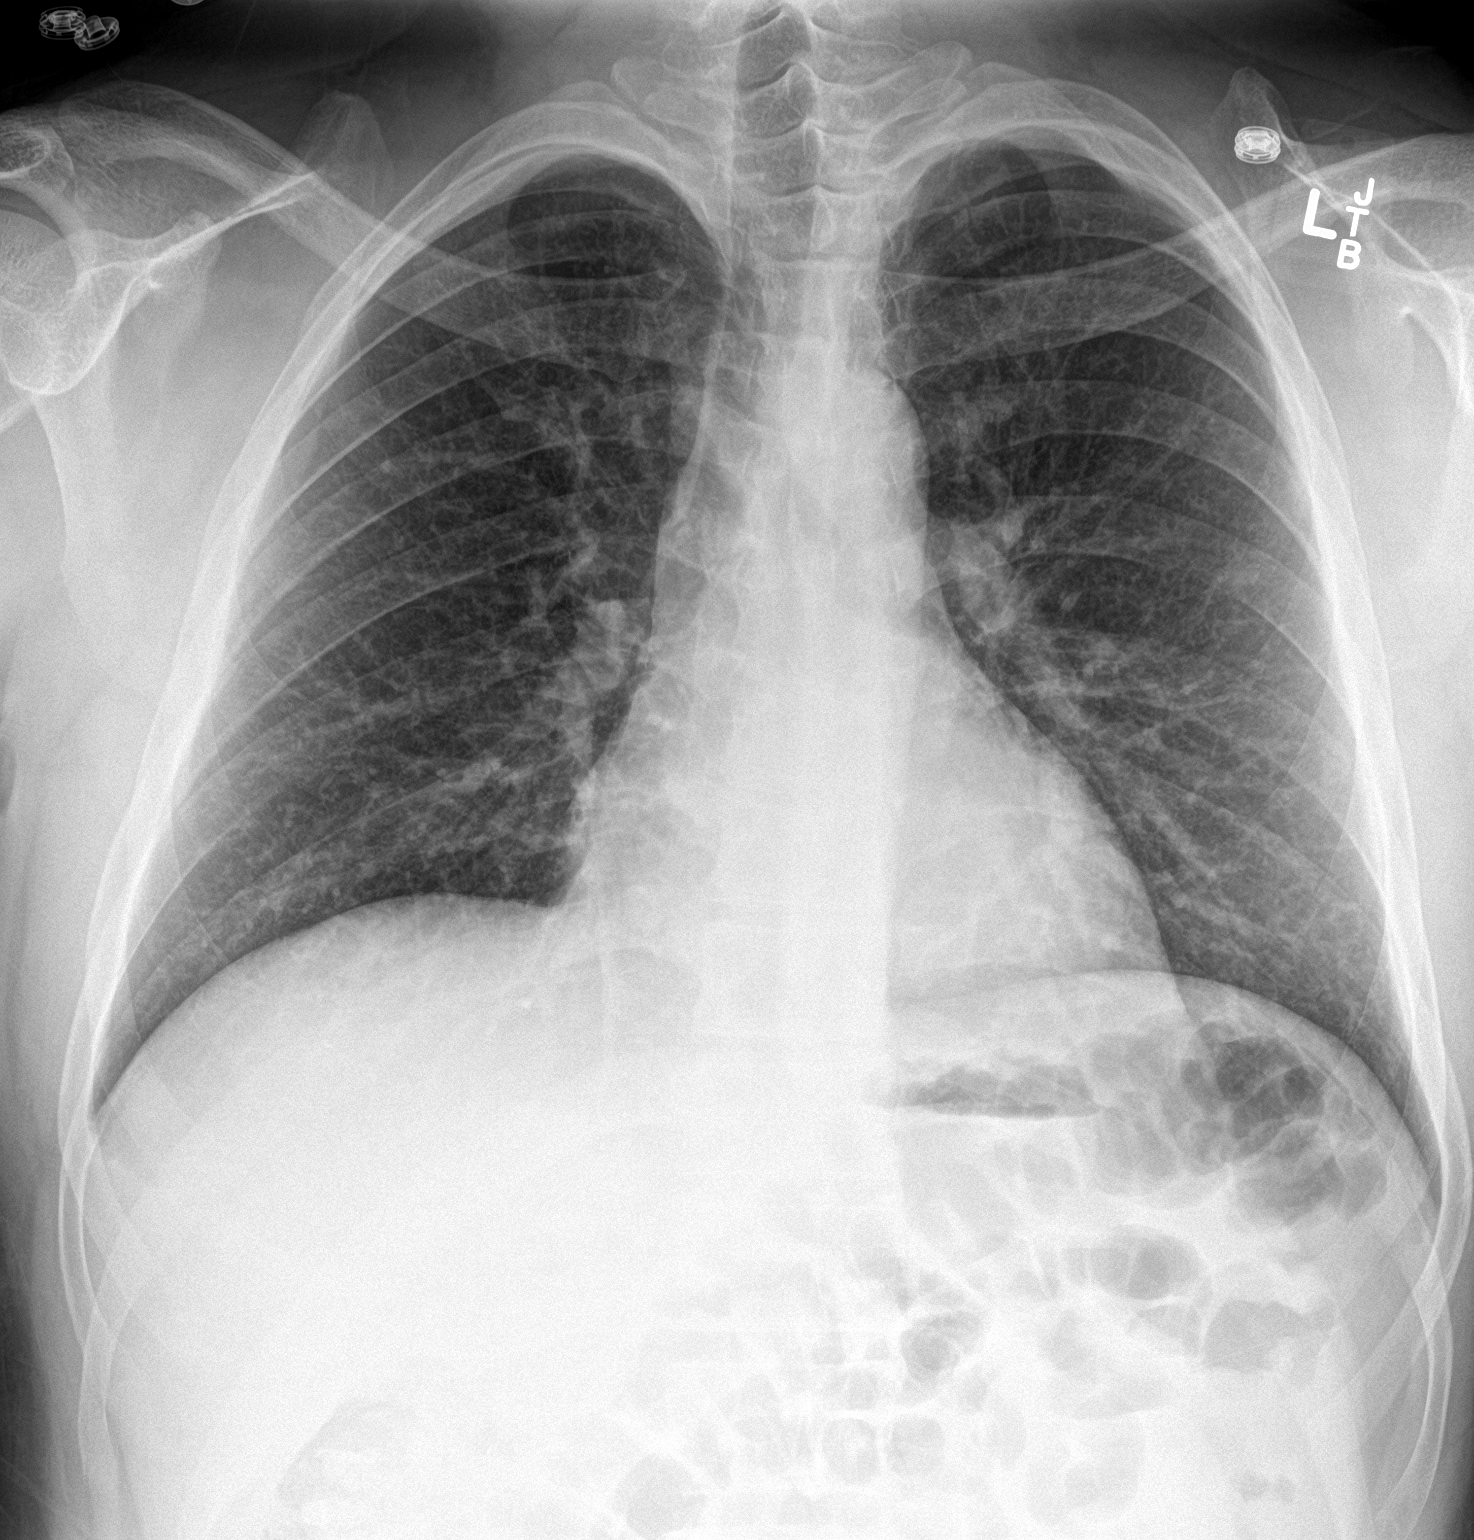

[chest lat]
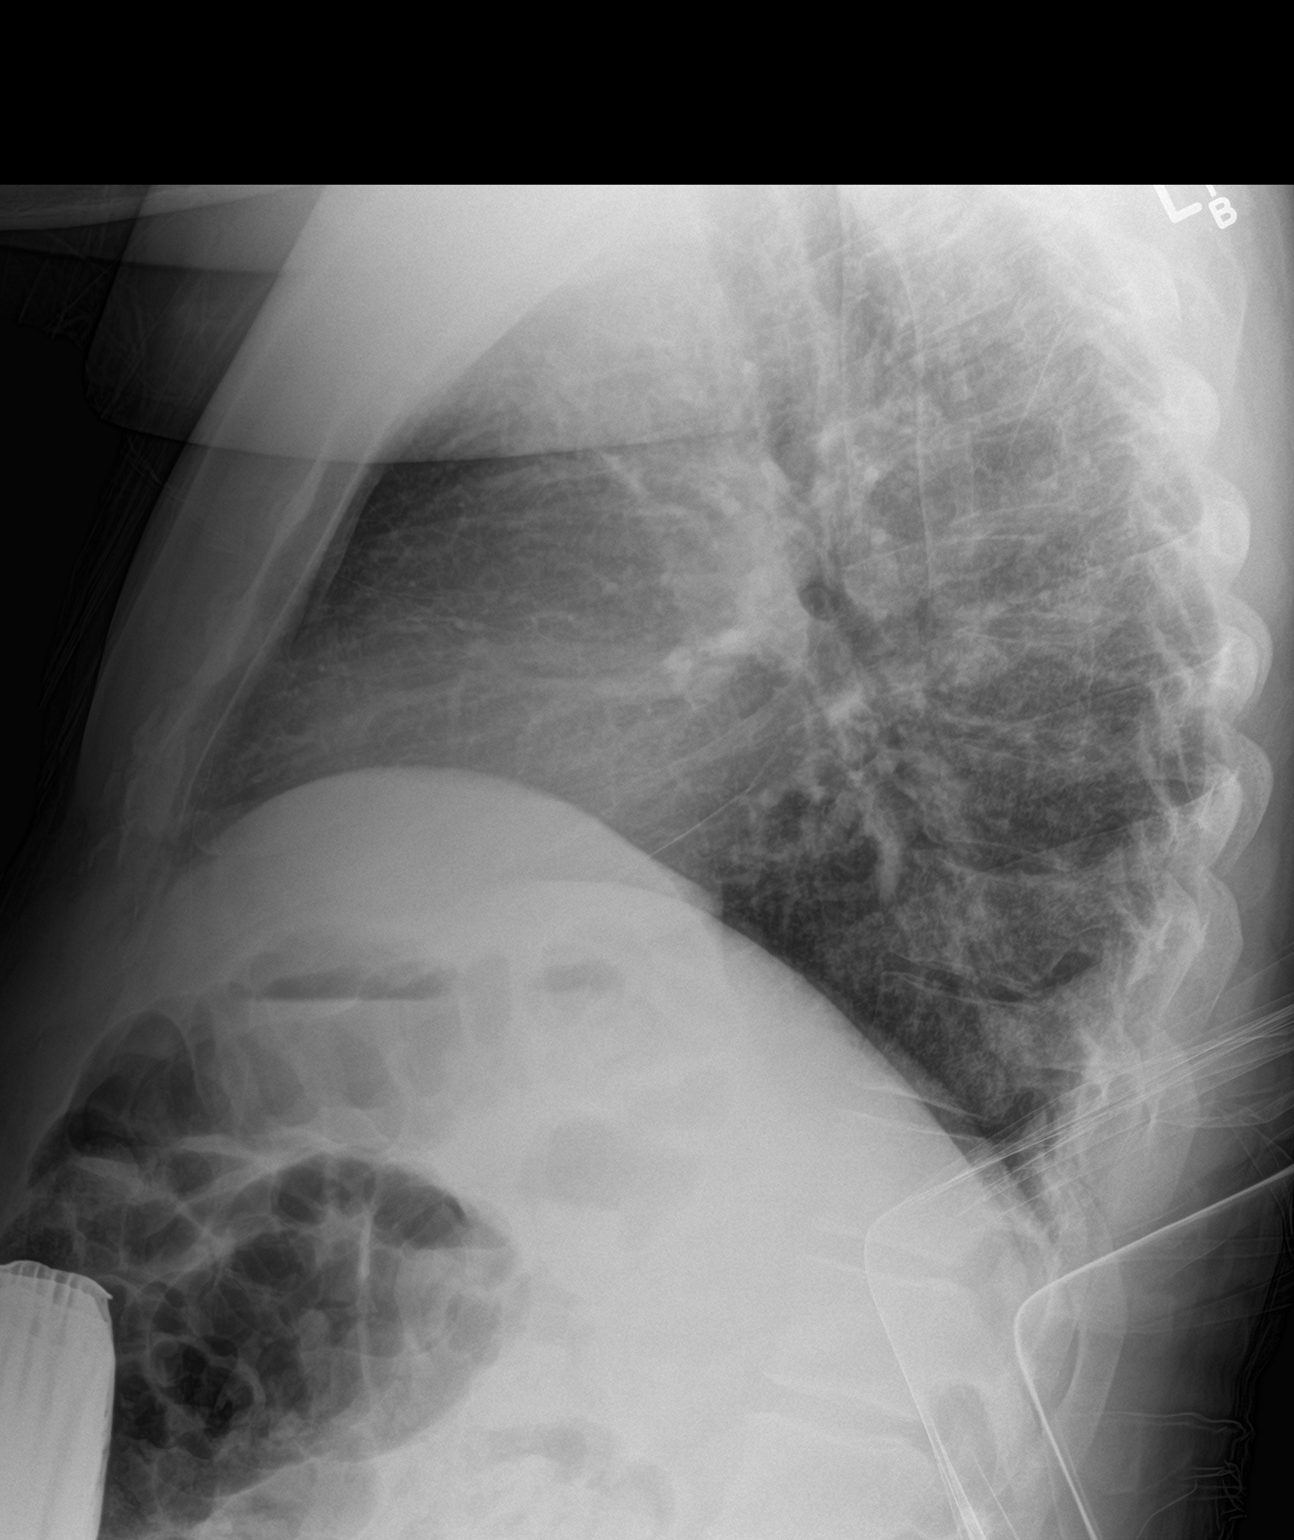

[2 of 2 positions shown; findings below may reference images not displayed]

FINDINGS: The heart size and mediastinal contours are within normal limits.
Both lungs are clear. No pneumothorax or pleural effusion is noted.
The visualized skeletal structures are unremarkable.
IMPRESSION: No active cardiopulmonary disease.

## 2018-05-10 ENCOUNTER — Emergency Department (HOSPITAL_BASED_OUTPATIENT_CLINIC_OR_DEPARTMENT_OTHER): Payer: BC Managed Care – PPO

## 2018-05-10 ENCOUNTER — Encounter (HOSPITAL_BASED_OUTPATIENT_CLINIC_OR_DEPARTMENT_OTHER): Payer: Self-pay | Admitting: *Deleted

## 2018-05-10 ENCOUNTER — Emergency Department (HOSPITAL_BASED_OUTPATIENT_CLINIC_OR_DEPARTMENT_OTHER)
Admission: EM | Admit: 2018-05-10 | Discharge: 2018-05-11 | Disposition: A | Discharge status: Home / Self Care | Payer: BC Managed Care – PPO | Attending: Emergency Medicine | Admitting: Emergency Medicine

## 2018-05-10 ENCOUNTER — Other Ambulatory Visit: Payer: Self-pay

## 2018-05-10 DIAGNOSIS — Z79899 Other long term (current) drug therapy: Secondary | ICD-10-CM | POA: Diagnosis not present

## 2018-05-10 DIAGNOSIS — L03115 Cellulitis of right lower limb: Secondary | ICD-10-CM | POA: Diagnosis not present

## 2018-05-10 DIAGNOSIS — F17210 Nicotine dependence, cigarettes, uncomplicated: Secondary | ICD-10-CM | POA: Diagnosis not present

## 2018-05-10 DIAGNOSIS — L089 Local infection of the skin and subcutaneous tissue, unspecified: Secondary | ICD-10-CM | POA: Diagnosis present

## 2018-05-10 DIAGNOSIS — I10 Essential (primary) hypertension: Secondary | ICD-10-CM | POA: Insufficient documentation

## 2018-05-10 LAB — COMPREHENSIVE METABOLIC PANEL
ALBUMIN: 3.2 g/dL — AB (ref 3.5–5.0)
ALT: 25 U/L (ref 0–44)
AST: 24 U/L (ref 15–41)
Alkaline Phosphatase: 58 U/L (ref 38–126)
Anion gap: 4 — ABNORMAL LOW (ref 5–15)
BILIRUBIN TOTAL: 0.5 mg/dL (ref 0.3–1.2)
BUN: 14 mg/dL (ref 6–20)
CALCIUM: 8 mg/dL — AB (ref 8.9–10.3)
CO2: 23 mmol/L (ref 22–32)
Chloride: 106 mmol/L (ref 98–111)
Creatinine, Ser: 1.3 mg/dL — ABNORMAL HIGH (ref 0.61–1.24)
GFR calc Af Amer: >60 mL/min (ref >60)
GFR calc non Af Amer: >60 mL/min (ref >60)
GLUCOSE: 124 mg/dL — AB (ref 70–99)
Potassium: 3.5 mmol/L (ref 3.5–5.1)
Sodium: 133 mmol/L — ABNORMAL LOW (ref 135–145)
TOTAL PROTEIN: 5.9 g/dL — AB (ref 6.5–8.1)

## 2018-05-10 LAB — CBC WITH DIFFERENTIAL/PLATELET
Abs Immature Granulocytes: 0.04 10*3/uL (ref 0.00–0.07)
BASOS ABS: 0 10*3/uL (ref 0.0–0.1)
Basophils Relative: 0 %
Eosinophils Absolute: 0.1 10*3/uL (ref 0.0–0.5)
Eosinophils Relative: 1 %
HCT: 47.4 % (ref 39.0–52.0)
HEMOGLOBIN: 15.4 g/dL (ref 13.0–17.0)
IMMATURE GRANULOCYTES: 1 %
LYMPHS ABS: 0.6 10*3/uL — AB (ref 0.7–4.0)
LYMPHS PCT: 10 %
MCH: 28.9 pg (ref 26.0–34.0)
MCHC: 32.5 g/dL (ref 30.0–36.0)
MCV: 89.1 fL (ref 80.0–100.0)
Monocytes Absolute: 1.3 10*3/uL — ABNORMAL HIGH (ref 0.1–1.0)
Monocytes Relative: 20 %
NEUTROS ABS: 4.3 10*3/uL (ref 1.7–7.7)
NEUTROS PCT: 68 %
NRBC: 0 % (ref 0.0–0.2)
Platelets: 149 10*3/uL — ABNORMAL LOW (ref 150–400)
RBC: 5.32 MIL/uL (ref 4.22–5.81)
RDW: 13.5 % (ref 11.5–15.5)
WBC: 6.4 10*3/uL (ref 4.0–10.5)

## 2018-05-10 LAB — URINALYSIS, ROUTINE W REFLEX MICROSCOPIC
Bilirubin Urine: NEGATIVE
Glucose, UA: NEGATIVE mg/dL
Hgb urine dipstick: NEGATIVE
KETONES UR: 15 mg/dL — AB
LEUKOCYTE UA: NEGATIVE
NITRITE: NEGATIVE
Protein, ur: NEGATIVE mg/dL
pH: 6 (ref 5.0–8.0)

## 2018-05-10 LAB — LACTIC ACID, PLASMA: Lactic Acid, Venous: 0.7 mmol/L (ref 0.5–1.9)

## 2018-05-10 MED ORDER — VANCOMYCIN HCL IN DEXTROSE 1-5 GM/200ML-% IV SOLN
1000.0000 mg | Freq: Two times a day (BID) | INTRAVENOUS | Status: DC
  Filled 2018-05-10: qty 200

## 2018-05-10 MED ORDER — LACTATED RINGERS IV BOLUS
1000.0000 mL | Freq: Once | INTRAVENOUS | Status: AC
  Administered 2018-05-10: 1000 mL via INTRAVENOUS

## 2018-05-10 MED ORDER — VANCOMYCIN HCL 1000 MG IV SOLR
INTRAVENOUS | Status: AC
  Filled 2018-05-10: qty 2000

## 2018-05-10 MED ORDER — HYDROMORPHONE HCL 1 MG/ML IJ SOLN
1.0000 mg | Freq: Once | INTRAMUSCULAR | Status: AC
  Administered 2018-05-10: 1 mg via INTRAVENOUS
  Filled 2018-05-10: qty 1

## 2018-05-10 MED ORDER — HYDROCODONE-ACETAMINOPHEN 5-325 MG PO TABS: 1.0000 | ORAL_TABLET | ORAL | 0 refills | Status: AC | PRN

## 2018-05-10 MED ORDER — CLINDAMYCIN HCL 150 MG PO CAPS: 450.0000 mg | ORAL_CAPSULE | Freq: Four times a day (QID) | ORAL | 0 refills | Status: AC

## 2018-05-10 MED ORDER — SODIUM CHLORIDE 0.9 % IV SOLN
2.0000 g | Freq: Two times a day (BID) | INTRAVENOUS | Status: DC
  Administered 2018-05-10: 2 g via INTRAVENOUS
  Filled 2018-05-10: qty 2

## 2018-05-10 MED ORDER — VANCOMYCIN HCL 10 G IV SOLR
2000.0000 mg | Freq: Once | INTRAVENOUS | Status: AC
  Administered 2018-05-10: 2000 mg via INTRAVENOUS
  Filled 2018-05-10: qty 2000

## 2018-05-10 MED ORDER — ONDANSETRON HCL 4 MG/2ML IJ SOLN
4.0000 mg | Freq: Once | INTRAMUSCULAR | Status: AC
  Administered 2018-05-10: 4 mg via INTRAVENOUS
  Filled 2018-05-10: qty 2

## 2018-05-10 MED ORDER — CEFEPIME HCL 2 G IJ SOLR
INTRAMUSCULAR | Status: AC
  Filled 2018-05-10: qty 2

## 2018-05-10 MED ORDER — SODIUM CHLORIDE 0.9 % IV SOLN
2.0000 g | Freq: Once | INTRAVENOUS | Status: DC
  Filled 2018-05-10: qty 2

## 2018-05-10 MED ORDER — FENTANYL CITRATE (PF) 100 MCG/2ML IJ SOLN
50.0000 ug | INTRAMUSCULAR | Status: DC | PRN
  Administered 2018-05-10: 50 ug via INTRAVENOUS
  Filled 2018-05-10: qty 2

## 2018-05-10 MED ORDER — VANCOMYCIN HCL IN DEXTROSE 1-5 GM/200ML-% IV SOLN: 1000.0000 mg | Freq: Once | INTRAVENOUS | Status: DC

## 2018-05-10 NOTE — ED Triage Notes (Addendum)
Pt reports he has an infection in his right foot since Tuesday. He saw his PCP yesterday and was started on doxycycline but states his foot is worse, redness has increased and pain is worse. Temp was 102 yesterday. He had tylenol today at 3:30 pm

## 2018-05-10 NOTE — Progress Notes (Signed)
Pharmacy Antibiotic Note  Carl Small is a 43 y.o. male admitted on 05/10/2018 with cellulitis.  Pharmacy has been consulted for vancomycin/aztreonam dosing. Per protocol, patient will be switched to cefepime and aztreonam d/c'd due to patient documented tolerating cephalexin here in the past. Reportedly on doxy pta. SCr 1.3 on admit - appears to be approximately at baseline.  Plan: D/c aztreonam per Rx protocol >> Start cefepime 2g IV q12h Vancomycin 2g IV x 1; then Vancomycin 1000 mg IV Q 12 hrs. Goal AUC 400-550. Expected AUC: 475 SCr used: 1.3 Monitor clinical progress, c/s, renal function F/u de-escalation plan/LOT, vancomycin levels as indicated   Height: 6\' 2"  (188 cm) Weight: 240 lb (108.9 kg) IBW/kg (Calculated) : 82.2  Temp (24hrs), Avg:99.9 F (37.7 C), Min:99.9 F (37.7 C), Max:99.9 F (37.7 C)  Recent Labs  Lab 05/10/18 2125  WBC 6.4  CREATININE 1.30*  LATICACIDVEN 0.7    Estimated Creatinine Clearance: 99.3 mL/min (A) (by C-G formula based on SCr of 1.3 mg/dL (H)).    Allergies  Allergen Reactions  . Penicillins Swelling    SWELLING REACTION UNSPECIFIED  Has patient had a PCN reaction causing immediate rash, facial/tongue/throat swelling, SOB or lightheadedness with hypotension: Yes Has patient had a PCN reaction causing severe rash involving mucus membranes or skin necrosis: No Has patient had a PCN reaction that required hospitalization No Has patient had a PCN reaction occurring within the last 10 years: No If all of the above answers are "NO", then may proceed with Cephalosporin use.   . Sulfa Antibiotics Hives  . Amoxicillin Other (See Comments)    UNSPECIFIED REACTION  NOTE: PT HAS SWELLING WITH IV PCN's    Babs Bertin, PharmD, BCPS Please check AMION for all [facility] contact numbers Clinical Pharmacist 05/10/2018 10:02 PM

## 2018-05-10 NOTE — ED Provider Notes (Signed)
[facility] EMERGENCY DEPARTMENT Provider Note   CSN: [id number] Arrival date & time: 05/10/18  2022     History   Chief Complaint Chief Complaint  Patient presents with  . Wound Check    HPI Carl Small is a 43 y.o. male.  HPI  43 year old male presents with right foot infection.  Started about 4 days ago.  Pain is worsening and the swelling and redness is worsening.  Yesterday he also developed cough with green sputum and occasionally coughing up small bits of blood.  Had a temperature of 102 yesterday.  Was told he had an upper respiratory infection by his PCP.  Started on doxycycline yesterday by his PCP.  However he has had previous infection in this foot before and states the doxycycline never works.  Pain is severe.  Since arriving to the emergency department, the wound has opened up and is draining.  Past Medical History:  Diagnosis Date  . Hypertension   . Torsion of testicle     Patient Active Problem List   Diagnosis Date Noted  . Hematemesis 12/14/2015  . Leukocytosis 12/14/2015  . Avascular necrosis of bone of hip (HCC) 12/12/2015  . Essential hypertension 12/06/2015  . 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 Osteoarthritis, hip, bilateral 12/02/2015    Past Surgical History:  Procedure Laterality Date  . FRACTURE SURGERY Right   . TESTICLE SURGERY  1996  . TOTAL HIP ARTHROPLASTY Right 12/12/2015   Procedure: TOTAL HIP ARTHROPLASTY ANTERIOR APPROACH;  Surgeon: Gean Birchwood, MD;  Location: [facility] OR;  Service: Orthopedics;  Laterality: Right;        Home Medications    Prior to Admission medications   Medication Sig Start Date End Date Taking? Authorizing Provider  clindamycin (CLEOCIN) 150 MG capsule Take 3 capsules (450 mg total) by mouth 4 (four) times daily for 7 days. X 7 days 05/10/18 05/17/18  Pricilla Loveless, MD  hydrochlorothiazide (HYDRODIURIL) 25 MG tablet Take 1 tablet (25 mg total) by mouth daily. 12/06/15    Benjiman Core D, PA-C  HYDROcodone-acetaminophen (NORCO) 5-325 MG tablet Take 1 tablet by mouth every 4 (four) hours as needed. 05/10/18   Pricilla Loveless, MD    Family History Family History  Problem Relation Age of Onset  . Heart disease Mother   . Hypertension Mother   . Diabetes Mother   . Epilepsy Mother     Social History Social History   Tobacco Use  . Smoking status: Current Every Day Smoker    Packs/day: 0.25    Years: 18.00    Pack years: 4.50    Types: Cigarettes  . Smokeless tobacco: Never Used  Substance Use Topics  . Alcohol use: Yes    Alcohol/week: 12.0 standard drinks    Types: 12 Standard drinks or equivalent per week    Comment: weekends  . Drug use: No     Allergies   Penicillins; Sulfa antibiotics; and Amoxicillin   Review of Systems Review of Systems  Constitutional: Positive for fever.  Respiratory: Positive for cough.   Musculoskeletal: Positive for joint swelling and myalgias.  Skin: Positive for color change and wound.  All other systems reviewed and are negative.    Physical Exam Updated Vital Signs BP (!) 130/92   Pulse 81   Temp 99.9 F (37.7 C) (Oral)   Resp 20   Ht 6\' 2"  (1.88 m)   Wt 108.9 kg   SpO2 95%   BMI 30.81 kg/m  Physical Exam Vitals signs and nursing note reviewed.  Constitutional:      Appearance: He is well-developed.  HENT:     Head: Normocephalic and atraumatic.     Right Ear: External ear normal.     Left Ear: External ear normal.     Nose: Nose normal.  Eyes:     General:        Right eye: No discharge.        Left eye: No discharge.  Neck:     Musculoskeletal: Neck supple.  Cardiovascular:     Rate and Rhythm: Regular rhythm. Tachycardia present.     Heart sounds: Normal heart sounds.  Pulmonary:     Effort: Pulmonary effort is normal. No tachypnea or accessory muscle usage.     Breath sounds: Wheezing (mild) present. No rhonchi or rales.  Abdominal:     Palpations: Abdomen is soft.      Tenderness: There is no abdominal tenderness.  Musculoskeletal:     Right foot: Tenderness and swelling present.       Feet:  Skin:    General: Skin is warm and dry.  Neurological:     Mental Status: He is alert.  Psychiatric:        Mood and Affect: Mood is not anxious.      ED Treatments / Results  Labs (all labs ordered are listed, but only abnormal results are displayed) Labs Reviewed  COMPREHENSIVE METABOLIC PANEL - Abnormal; Notable for the following components:      Result Value   Sodium 133 (*)    Glucose, Bld 124 (*)    Creatinine, Ser 1.30 (*)    Calcium 8.0 (*)    Total Protein 5.9 (*)    Albumin 3.2 (*)    Anion gap 4 (*)    All other components within normal limits  CBC WITH DIFFERENTIAL/PLATELET - Abnormal; Notable for the following components:   Platelets 149 (*)    Lymphs Abs 0.6 (*)    Monocytes Absolute 1.3 (*)    All other components within normal limits  URINALYSIS, ROUTINE W REFLEX MICROSCOPIC - Abnormal; Notable for the following components:   Specific Gravity, Urine >1.030 (*)    Ketones, ur 15 (*)    All other components within normal limits  CULTURE, BLOOD (ROUTINE X 2)  CULTURE, BLOOD (ROUTINE X 2)  LACTIC ACID, PLASMA  LACTIC ACID, PLASMA  INFLUENZA PANEL BY PCR (TYPE A & B)    EKG None  Radiology Dg Chest 2 View  Result Date: 05/10/2018 CLINICAL DATA:  RIGHT foot infection since Tuesday, swelling and bleeding. Also cough and fever. EXAM: CHEST - 2 VIEW COMPARISON:  Chest x-rays dated 12/14/2015 and 12/09/2015. FINDINGS: Lungs are clear. No pleural effusion seen. Heart size and mediastinal contours are within normal limits. No osseous abnormality. IMPRESSION: No acute findings.  No evidence of pneumonia or pulmonary edema. Electronically Signed   By: Bary RichardStan  Maynard M.D.   On: 05/10/2018 22:52   Dg Foot Complete Right  Result Date: 05/10/2018 CLINICAL DATA:  RIGHT foot infection since Tuesday, swelling and bleeding from RIGHT little  toe. EXAM: RIGHT FOOT COMPLETE - 3+ VIEW COMPARISON:  Plain film of the RIGHT foot dated 08/08/2017. 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 No acute appearing osseous abnormality. No erosions, focal demineralization or destructive changes to suggest osteomyelitis. IMPRESSION: Stable appearance of the  RIGHT foot compared to plain film of 08/08/2017. No acute findings. No radiographic evidence of osteomyelitis. Electronically Signed   By: Bary Richard M.D.   On: 05/10/2018 22:55    Procedures Procedures (including critical care time)  Medications Ordered in ED Medications  fentaNYL (SUBLIMAZE) injection 50 mcg (50 mcg Intravenous Given 05/10/18 2132)  lactated ringers bolus 1,000 mL (1,000 mLs Intravenous New Bag/Given 05/10/18 2301)  ceFEPIme (MAXIPIME) 2 g in sodium chloride 0.9 % 100 mL IVPB (2 g Intravenous New Bag/Given 05/10/18 2302)  vancomycin (VANCOCIN) 2,000 mg in sodium chloride 0.9 % 500 mL IVPB (2,000 mg Intravenous New Bag/Given 05/10/18 2300)  vancomycin (VANCOCIN) IVPB 1000 mg/200 mL premix (has no administration in time range)  ceFEPIme (MAXIPIME) 2 g injection (has no administration in time range)  vancomycin (VANCOCIN) 1000 MG powder (has no administration in time range)  ondansetron (ZOFRAN) injection 4 mg (4 mg Intravenous Given 05/10/18 2131)  HYDROmorphone (DILAUDID) injection 1 mg (1 mg Intravenous Given 05/10/18 2316)     Initial Impression / Assessment and Plan / ED Course  I have reviewed the triage vital signs and the nursing notes.  Pertinent labs & imaging results that were available during my care of the patient were reviewed by me and considered in my medical decision making (see chart for details).     Patient's labs and work-up are thus far unremarkable.  No obvious osteomyelitis and given the clinical picture of only a few days of  infection this would be less likely at this time.  I have given him IV antibiotics as we worked up for sepsis though his lactate, WBC and other tests are benign.  Unclear if the fevers from the foot or the upper respiratory symptoms.  I discussed admission given the purulent drainage and significant discomfort though I think necrotizing fasciitis is pretty unlikely.  He declines and wants to go home.  He does not think the doxycycline is helpful though he is only been on it for a day.  I will change him to clindamycin and prescribe short course of Norco for pain.  We discussed return precautions and the need for close PCP follow-up for wound recheck.  Final Clinical Impressions(s) / ED Diagnoses   Final diagnoses:  Cellulitis of right foot    ED Discharge Orders         Ordered    clindamycin (CLEOCIN) 150 MG capsule  4 times daily     05/10/18 2344    HYDROcodone-acetaminophen (NORCO) 5-325 MG tablet  Every 4 hours PRN     05/10/18 2344           Pricilla Loveless, MD 05/11/18 0000

## 2018-05-10 NOTE — ED Notes (Signed)
Pt was ambulatory into facility. Pt was limping. RN offered patient wheelchair and he stated that he would like one. RN accommodated and got patient wheelchair

## 2018-05-10 NOTE — Discharge Instructions (Addendum)
If you develop fever, worsening or uncontrolled pain, worsening redness or swelling, or any other new/concerning symptoms then return to the ER for evaluation.  Otherwise follow-up with your primary care physician in the next 2 days.

## 2018-05-11 LAB — INFLUENZA PANEL BY PCR (TYPE A & B)
INFLAPCR: POSITIVE — AB
INFLBPCR: NEGATIVE

## 2018-05-16 LAB — CULTURE, BLOOD (ROUTINE X 2)
CULTURE: NO GROWTH
Culture: NO GROWTH
Special Requests: ADEQUATE
Special Requests: ADEQUATE

## 2019-01-27 ENCOUNTER — Other Ambulatory Visit: Payer: Self-pay

## 2019-01-27 DIAGNOSIS — Z20822 Contact with and (suspected) exposure to covid-19: Secondary | ICD-10-CM

## 2019-01-28 ENCOUNTER — Telehealth: Payer: BC Managed Care – PPO | Admitting: Family

## 2019-01-28 DIAGNOSIS — G43809 Other migraine, not intractable, without status migrainosus: Secondary | ICD-10-CM

## 2019-01-28 DIAGNOSIS — R6883 Chills (without fever): Secondary | ICD-10-CM

## 2019-01-28 DIAGNOSIS — R112 Nausea with vomiting, unspecified: Secondary | ICD-10-CM

## 2019-01-28 LAB — NOVEL CORONAVIRUS, NAA: SARS-CoV-2, NAA: DETECTED — AB

## 2019-01-28 NOTE — Progress Notes (Signed)
Based on what you shared with me, I feel your condition warrants further evaluation and I recommend that you be seen for a face to face office visit.  Given your current symptoms, you need to be seen face to face to rule out more serious problems.   NOTE: If you entered your credit card information for this eVisit, you will not be charged. You may see a "hold" on your card for the $35 but that hold will drop off and you will not have a charge processed.  If you are having a true medical emergency please call 911.     For an urgent face to face visit, [facility] has four urgent care centers for your convenience:    NEW:  [facility] [address] [place], [place] [address] .  Monday - Friday 10 am - 6 pm    . [facility]    [phone]                  Get Driving Directions  [address] [place], [place] [address] . 10 am to 8 pm Monday-Friday . 12 pm to 8 pm Saturday-Sunday   . [facility] at [place]                  Get Driving Directions  [address], [place], [place] [address] . 8 am to 8 pm Monday-Friday . 9 am to 6 pm Saturday . 11 am to 6 pm Sunday     . [facility] at [place]                  Get Driving Directions   [address], [place] [address] . 8 am to 8 pm Monday-Friday . 8 am to 4 pm Saturday-Sunday    . [facility] [place]                    Get Driving Directions  [phone]  [address], [place], [place] [address]  . Monday-Friday, 12 PM to 6 PM    Your e-visit answers were reviewed by a board certified advanced clinical practitioner to complete your personal care plan.  Thank you for using e-Visits.
